# Patient Record
Sex: Male | Born: 1945 | Race: Black or African American | Hispanic: No | Smoking: Former smoker
Health system: Southern US, Community
[De-identification: ages and names within clinical notes are randomized; demographics above are authoritative.]

## PROBLEM LIST (undated history)

## (undated) DIAGNOSIS — I10 Essential (primary) hypertension: Secondary | ICD-10-CM

## (undated) DIAGNOSIS — C61 Malignant neoplasm of prostate: Secondary | ICD-10-CM

## (undated) DIAGNOSIS — E7800 Pure hypercholesterolemia, unspecified: Secondary | ICD-10-CM

## (undated) DIAGNOSIS — E119 Type 2 diabetes mellitus without complications: Secondary | ICD-10-CM

## (undated) DIAGNOSIS — F209 Schizophrenia, unspecified: Secondary | ICD-10-CM

## (undated) HISTORY — PX: PROSTATE CRYOABLATION: SUR358

---

## 2005-11-17 ENCOUNTER — Inpatient Hospital Stay: Payer: Self-pay | Admitting: Psychiatry

## 2005-11-20 ENCOUNTER — Other Ambulatory Visit: Payer: Self-pay

## 2006-07-30 ENCOUNTER — Inpatient Hospital Stay: Payer: Self-pay | Admitting: Unknown Physician Specialty

## 2007-03-06 ENCOUNTER — Emergency Department: Payer: Self-pay | Admitting: Emergency Medicine

## 2007-03-06 ENCOUNTER — Other Ambulatory Visit: Payer: Self-pay

## 2007-09-21 ENCOUNTER — Emergency Department: Payer: Self-pay | Admitting: Emergency Medicine

## 2007-09-21 ENCOUNTER — Other Ambulatory Visit: Payer: Self-pay

## 2008-08-26 ENCOUNTER — Ambulatory Visit: Payer: Self-pay | Admitting: Urology

## 2008-09-09 ENCOUNTER — Ambulatory Visit: Payer: Self-pay | Admitting: Urology

## 2010-10-26 ENCOUNTER — Inpatient Hospital Stay: Payer: Self-pay | Admitting: Internal Medicine

## 2010-11-01 ENCOUNTER — Inpatient Hospital Stay: Payer: Self-pay | Admitting: Psychiatry

## 2011-08-12 ENCOUNTER — Emergency Department: Payer: Self-pay | Admitting: Emergency Medicine

## 2013-03-08 ENCOUNTER — Emergency Department: Payer: Self-pay | Admitting: Emergency Medicine

## 2013-03-08 LAB — TSH: Thyroid Stimulating Horm: 1.09 u[IU]/mL

## 2013-03-08 LAB — COMPREHENSIVE METABOLIC PANEL
Alkaline Phosphatase: 98 U/L (ref 50–136)
BUN: 20 mg/dL — ABNORMAL HIGH (ref 7–18)
Bilirubin,Total: 0.5 mg/dL (ref 0.2–1.0)
Chloride: 96 mmol/L — ABNORMAL LOW (ref 98–107)
Co2: 22 mmol/L (ref 21–32)
Creatinine: 1.4 mg/dL — ABNORMAL HIGH (ref 0.60–1.30)
EGFR (African American): >60
Glucose: 459 mg/dL — ABNORMAL HIGH (ref 65–99)
Potassium: 4.5 mmol/L (ref 3.5–5.1)
SGOT(AST): 27 U/L (ref 15–37)
SGPT (ALT): 33 U/L (ref 12–78)
Sodium: 132 mmol/L — ABNORMAL LOW (ref 136–145)

## 2013-03-08 LAB — CBC
HCT: 42.8 % (ref 40.0–52.0)
MCHC: 32.4 g/dL (ref 32.0–36.0)
RBC: 4.91 10*6/uL (ref 4.40–5.90)

## 2013-03-08 LAB — ETHANOL
Ethanol %: <0.003 % (ref 0.000–0.080)
Ethanol: <3 mg/dL

## 2013-03-09 LAB — URINALYSIS, COMPLETE
Bacteria: NONE SEEN
Bilirubin,UR: NEGATIVE
Blood: NEGATIVE
Leukocyte Esterase: NEGATIVE
Ph: 6 (ref 4.5–8.0)
Protein: NEGATIVE
RBC,UR: <1 /HPF (ref 0–5)
Specific Gravity: 1.027 (ref 1.003–1.030)
Squamous Epithelial: NONE SEEN
WBC UR: NONE SEEN /HPF (ref 0–5)

## 2013-03-09 LAB — DRUG SCREEN, URINE
Amphetamines, Ur Screen: NEGATIVE (ref ?–1000)
Barbiturates, Ur Screen: NEGATIVE (ref ?–200)
Cannabinoid 50 Ng, Ur ~~LOC~~: NEGATIVE (ref ?–50)
MDMA (Ecstasy)Ur Screen: NEGATIVE (ref ?–500)
Phencyclidine (PCP) Ur S: NEGATIVE (ref ?–25)

## 2013-03-14 LAB — BASIC METABOLIC PANEL
Anion Gap: 7 (ref 7–16)
Calcium, Total: 10.1 mg/dL (ref 8.5–10.1)
Co2: 27 mmol/L (ref 21–32)
Creatinine: 1.13 mg/dL (ref 0.60–1.30)
EGFR (African American): >60
EGFR (Non-African Amer.): >60
Glucose: 60 mg/dL — ABNORMAL LOW (ref 65–99)
Potassium: 3.8 mmol/L (ref 3.5–5.1)

## 2013-12-04 ENCOUNTER — Inpatient Hospital Stay: Payer: Self-pay | Admitting: Internal Medicine

## 2013-12-04 LAB — URINALYSIS, COMPLETE
Bacteria: NONE SEEN
Bilirubin,UR: NEGATIVE
Blood: NEGATIVE
Glucose,UR: >=500 mg/dL (ref 0–75)
LEUKOCYTE ESTERASE: NEGATIVE
Nitrite: NEGATIVE
PH: 5 (ref 4.5–8.0)
Protein: NEGATIVE
RBC,UR: NONE SEEN /HPF (ref 0–5)
SQUAMOUS EPITHELIAL: NONE SEEN
Specific Gravity: 1.029 (ref 1.003–1.030)
WBC UR: <1 /HPF (ref 0–5)

## 2013-12-04 LAB — CBC WITH DIFFERENTIAL/PLATELET
Basophil #: 0 10*3/uL (ref 0.0–0.1)
Basophil %: 0.6 %
EOS PCT: 0.3 %
Eosinophil #: 0 10*3/uL (ref 0.0–0.7)
HCT: 39.8 % — ABNORMAL LOW (ref 40.0–52.0)
HGB: 12.7 g/dL — ABNORMAL LOW (ref 13.0–18.0)
LYMPHS PCT: 11.7 %
Lymphocyte #: 1 10*3/uL (ref 1.0–3.6)
MCH: 29.4 pg (ref 26.0–34.0)
MCHC: 32 g/dL (ref 32.0–36.0)
MCV: 92 fL (ref 80–100)
MONO ABS: 0.6 x10 3/mm (ref 0.2–1.0)
Monocyte %: 7.1 %
NEUTROS PCT: 80.3 %
Neutrophil #: 6.8 10*3/uL — ABNORMAL HIGH (ref 1.4–6.5)
Platelet: 219 10*3/uL (ref 150–440)
RBC: 4.33 10*6/uL — AB (ref 4.40–5.90)
RDW: 12.9 % (ref 11.5–14.5)
WBC: 8.5 10*3/uL (ref 3.8–10.6)

## 2013-12-04 LAB — COMPREHENSIVE METABOLIC PANEL
ANION GAP: 13 (ref 7–16)
Albumin: 3.6 g/dL (ref 3.4–5.0)
Alkaline Phosphatase: 72 U/L
BILIRUBIN TOTAL: 0.5 mg/dL (ref 0.2–1.0)
BUN: 29 mg/dL — AB (ref 7–18)
CO2: 22 mmol/L (ref 21–32)
Calcium, Total: 9.8 mg/dL (ref 8.5–10.1)
Chloride: 101 mmol/L (ref 98–107)
Creatinine: 1.28 mg/dL (ref 0.60–1.30)
EGFR (African American): >60
EGFR (Non-African Amer.): 58 — ABNORMAL LOW
Glucose: 375 mg/dL — ABNORMAL HIGH (ref 65–99)
Osmolality: 293 (ref 275–301)
POTASSIUM: 4.6 mmol/L (ref 3.5–5.1)
SGOT(AST): 39 U/L — ABNORMAL HIGH (ref 15–37)
SGPT (ALT): 27 U/L (ref 12–78)
Sodium: 136 mmol/L (ref 136–145)
Total Protein: 7.3 g/dL (ref 6.4–8.2)

## 2013-12-04 LAB — TROPONIN I: Troponin-I: 0.21 ng/mL — ABNORMAL HIGH

## 2013-12-04 LAB — DRUG SCREEN, URINE
Amphetamines, Ur Screen: NEGATIVE (ref ?–1000)
Barbiturates, Ur Screen: NEGATIVE (ref ?–200)
Benzodiazepine, Ur Scrn: NEGATIVE (ref ?–200)
CANNABINOID 50 NG, UR ~~LOC~~: NEGATIVE (ref ?–50)
COCAINE METABOLITE, UR ~~LOC~~: NEGATIVE (ref ?–300)
MDMA (Ecstasy)Ur Screen: NEGATIVE (ref ?–500)
Methadone, Ur Screen: NEGATIVE (ref ?–300)
Opiate, Ur Screen: NEGATIVE (ref ?–300)
Phencyclidine (PCP) Ur S: NEGATIVE (ref ?–25)
TRICYCLIC, UR SCREEN: NEGATIVE (ref ?–1000)

## 2013-12-04 LAB — MAGNESIUM: Magnesium: 2.4 mg/dL

## 2013-12-04 LAB — TSH: Thyroid Stimulating Horm: 0.701 u[IU]/mL

## 2013-12-04 LAB — AMMONIA: Ammonia, Plasma: 15 mcmol/L (ref 11–32)

## 2013-12-04 LAB — ETHANOL
Ethanol %: <0.003 % (ref 0.000–0.080)
Ethanol: <3 mg/dL

## 2013-12-05 LAB — CBC WITH DIFFERENTIAL/PLATELET
BASOS ABS: 0.1 10*3/uL (ref 0.0–0.1)
Basophil %: 0.9 %
EOS PCT: 0.3 %
Eosinophil #: 0 10*3/uL (ref 0.0–0.7)
HCT: 39 % — ABNORMAL LOW (ref 40.0–52.0)
HGB: 12.8 g/dL — AB (ref 13.0–18.0)
LYMPHS ABS: 1.6 10*3/uL (ref 1.0–3.6)
LYMPHS PCT: 20.6 %
MCH: 29.9 pg (ref 26.0–34.0)
MCHC: 32.9 g/dL (ref 32.0–36.0)
MCV: 91 fL (ref 80–100)
MONO ABS: 0.7 x10 3/mm (ref 0.2–1.0)
Monocyte %: 9 %
Neutrophil #: 5.4 10*3/uL (ref 1.4–6.5)
Neutrophil %: 69.2 %
Platelet: 215 10*3/uL (ref 150–440)
RBC: 4.28 10*6/uL — ABNORMAL LOW (ref 4.40–5.90)
RDW: 12.8 % (ref 11.5–14.5)
WBC: 7.8 10*3/uL (ref 3.8–10.6)

## 2013-12-05 LAB — CK TOTAL AND CKMB (NOT AT ARMC)
CK, TOTAL: 325 U/L — AB (ref 35–232)
CK, TOTAL: 480 U/L — AB (ref 35–232)
CK, Total: 273 U/L — ABNORMAL HIGH (ref 35–232)
CK-MB: 6.9 ng/mL — AB (ref 0.5–3.6)
CK-MB: 6.9 ng/mL — AB (ref 0.5–3.6)
CK-MB: 7 ng/mL — ABNORMAL HIGH (ref 0.5–3.6)

## 2013-12-05 LAB — COMPREHENSIVE METABOLIC PANEL
ALBUMIN: 3.4 g/dL (ref 3.4–5.0)
ANION GAP: 11 (ref 7–16)
Alkaline Phosphatase: 71 U/L
BUN: 19 mg/dL — ABNORMAL HIGH (ref 7–18)
Bilirubin,Total: 0.4 mg/dL (ref 0.2–1.0)
CALCIUM: 9.4 mg/dL (ref 8.5–10.1)
CHLORIDE: 110 mmol/L — AB (ref 98–107)
Co2: 26 mmol/L (ref 21–32)
Creatinine: 1.18 mg/dL (ref 0.60–1.30)
EGFR (African American): >60
Glucose: 68 mg/dL (ref 65–99)
Osmolality: 293 (ref 275–301)
Potassium: 3.7 mmol/L (ref 3.5–5.1)
SGOT(AST): 28 U/L (ref 15–37)
SGPT (ALT): 29 U/L (ref 12–78)
SODIUM: 147 mmol/L — AB (ref 136–145)
Total Protein: 7 g/dL (ref 6.4–8.2)

## 2013-12-05 LAB — TROPONIN I
TROPONIN-I: 1.9 ng/mL — AB
Troponin-I: 1.7 ng/mL — ABNORMAL HIGH
Troponin-I: 1.8 ng/mL — ABNORMAL HIGH

## 2013-12-05 LAB — MAGNESIUM: Magnesium: 2.3 mg/dL

## 2013-12-05 LAB — CK: CK, TOTAL: 497 U/L — AB (ref 35–232)

## 2013-12-05 LAB — TSH: THYROID STIMULATING HORM: 0.44 u[IU]/mL — AB

## 2013-12-05 LAB — CK-MB: CK-MB: 6.8 ng/mL — AB (ref 0.5–3.6)

## 2013-12-06 LAB — URINE CULTURE

## 2013-12-06 LAB — CK: CK, TOTAL: 315 U/L — AB (ref 35–232)

## 2013-12-06 LAB — TROPONIN I: Troponin-I: 0.76 ng/mL — ABNORMAL HIGH

## 2013-12-09 ENCOUNTER — Inpatient Hospital Stay: Payer: Self-pay | Admitting: Psychiatry

## 2013-12-09 LAB — CULTURE, BLOOD (SINGLE)

## 2013-12-18 LAB — HEMOGLOBIN A1C: Hemoglobin A1C: 14.8 % — ABNORMAL HIGH (ref 4.2–6.3)

## 2014-06-16 ENCOUNTER — Observation Stay: Payer: Self-pay | Admitting: Internal Medicine

## 2014-06-16 LAB — COMPREHENSIVE METABOLIC PANEL
ALK PHOS: 79 U/L
Albumin: 3.6 g/dL (ref 3.4–5.0)
Anion Gap: 11 (ref 7–16)
BILIRUBIN TOTAL: 0.7 mg/dL (ref 0.2–1.0)
BUN: 31 mg/dL — ABNORMAL HIGH (ref 7–18)
Calcium, Total: 9.6 mg/dL (ref 8.5–10.1)
Chloride: 81 mmol/L — ABNORMAL LOW (ref 98–107)
Co2: 24 mmol/L (ref 21–32)
Creatinine: 1.79 mg/dL — ABNORMAL HIGH (ref 0.60–1.30)
EGFR (African American): 45 — ABNORMAL LOW
EGFR (Non-African Amer.): 39 — ABNORMAL LOW
GLUCOSE: 808 mg/dL — AB (ref 65–99)
Osmolality: 281 (ref 275–301)
POTASSIUM: 5.4 mmol/L — AB (ref 3.5–5.1)
SGOT(AST): 38 U/L — ABNORMAL HIGH (ref 15–37)
SGPT (ALT): 24 U/L
SODIUM: 116 mmol/L — AB (ref 136–145)
Total Protein: 8.3 g/dL — ABNORMAL HIGH (ref 6.4–8.2)

## 2014-06-16 LAB — CBC WITH DIFFERENTIAL/PLATELET
BASOS PCT: 0.9 %
Basophil #: 0.1 10*3/uL (ref 0.0–0.1)
Eosinophil #: 0 10*3/uL (ref 0.0–0.7)
Eosinophil %: 0.5 %
HCT: 46.3 % (ref 40.0–52.0)
HGB: 14.3 g/dL (ref 13.0–18.0)
LYMPHS ABS: 1.7 10*3/uL (ref 1.0–3.6)
Lymphocyte %: 22.7 %
MCH: 26.8 pg (ref 26.0–34.0)
MCHC: 30.9 g/dL — AB (ref 32.0–36.0)
MCV: 87 fL (ref 80–100)
MONO ABS: 0.6 x10 3/mm (ref 0.2–1.0)
Monocyte %: 8.6 %
Neutrophil #: 4.9 10*3/uL (ref 1.4–6.5)
Neutrophil %: 67.3 %
PLATELETS: 229 10*3/uL (ref 150–440)
RBC: 5.35 10*6/uL (ref 4.40–5.90)
RDW: 15.8 % — AB (ref 11.5–14.5)
WBC: 7.3 10*3/uL (ref 3.8–10.6)

## 2014-06-16 LAB — BASIC METABOLIC PANEL
ANION GAP: 8 (ref 7–16)
BUN: 28 mg/dL — ABNORMAL HIGH (ref 7–18)
Calcium, Total: 8.4 mg/dL — ABNORMAL LOW (ref 8.5–10.1)
Chloride: 91 mmol/L — ABNORMAL LOW (ref 98–107)
Co2: 28 mmol/L (ref 21–32)
Creatinine: 1.58 mg/dL — ABNORMAL HIGH (ref 0.60–1.30)
EGFR (African American): 52 — ABNORMAL LOW
EGFR (Non-African Amer.): 45 — ABNORMAL LOW
GLUCOSE: 558 mg/dL — AB (ref 65–99)
Osmolality: 286 (ref 275–301)
Potassium: 4.6 mmol/L (ref 3.5–5.1)
Sodium: 127 mmol/L — ABNORMAL LOW (ref 136–145)

## 2014-06-16 LAB — URINALYSIS, COMPLETE
BACTERIA: NONE SEEN
BILIRUBIN, UR: NEGATIVE
Blood: NEGATIVE
LEUKOCYTE ESTERASE: NEGATIVE
Nitrite: NEGATIVE
Ph: 6 (ref 4.5–8.0)
Protein: NEGATIVE
RBC, UR: NONE SEEN /HPF (ref 0–5)
Specific Gravity: 1.018 (ref 1.003–1.030)
Squamous Epithelial: <1
WBC UR: NONE SEEN /HPF (ref 0–5)

## 2014-06-17 LAB — BASIC METABOLIC PANEL
Anion Gap: 9 (ref 7–16)
BUN: 21 mg/dL — ABNORMAL HIGH (ref 7–18)
CREATININE: 1.18 mg/dL (ref 0.60–1.30)
Calcium, Total: 8.8 mg/dL (ref 8.5–10.1)
Chloride: 98 mmol/L (ref 98–107)
Co2: 29 mmol/L (ref 21–32)
EGFR (African American): >60
EGFR (Non-African Amer.): >60
Glucose: 129 mg/dL — ABNORMAL HIGH (ref 65–99)
Osmolality: 277 (ref 275–301)
Potassium: 3.4 mmol/L — ABNORMAL LOW (ref 3.5–5.1)
Sodium: 136 mmol/L (ref 136–145)

## 2014-06-17 LAB — CBC WITH DIFFERENTIAL/PLATELET
BASOS ABS: 0 10*3/uL (ref 0.0–0.1)
Basophil %: 0.5 %
Eosinophil #: 0.1 10*3/uL (ref 0.0–0.7)
Eosinophil %: 1.6 %
HCT: 42.3 % (ref 40.0–52.0)
HGB: 13.1 g/dL (ref 13.0–18.0)
LYMPHS ABS: 2 10*3/uL (ref 1.0–3.6)
Lymphocyte %: 32.3 %
MCH: 26 pg (ref 26.0–34.0)
MCHC: 31 g/dL — AB (ref 32.0–36.0)
MCV: 84 fL (ref 80–100)
Monocyte #: 0.6 x10 3/mm (ref 0.2–1.0)
Monocyte %: 9.8 %
NEUTROS ABS: 3.5 10*3/uL (ref 1.4–6.5)
NEUTROS PCT: 55.8 %
PLATELETS: 195 10*3/uL (ref 150–440)
RBC: 5.04 10*6/uL (ref 4.40–5.90)
RDW: 15.9 % — AB (ref 11.5–14.5)
WBC: 6.2 10*3/uL (ref 3.8–10.6)

## 2014-06-22 ENCOUNTER — Inpatient Hospital Stay (HOSPITAL_COMMUNITY)
Admission: EM | Admit: 2014-06-22 | Discharge: 2014-06-29 | DRG: 637 | Disposition: A | Discharge status: Home / Self Care | Payer: Medicare Other | Attending: Internal Medicine | Admitting: Internal Medicine

## 2014-06-22 ENCOUNTER — Emergency Department (HOSPITAL_COMMUNITY): Payer: Medicare Other

## 2014-06-22 ENCOUNTER — Encounter (HOSPITAL_COMMUNITY): Payer: Self-pay | Admitting: Emergency Medicine

## 2014-06-22 DIAGNOSIS — Z794 Long term (current) use of insulin: Secondary | ICD-10-CM

## 2014-06-22 DIAGNOSIS — G9341 Metabolic encephalopathy: Secondary | ICD-10-CM | POA: Diagnosis present

## 2014-06-22 DIAGNOSIS — R5381 Other malaise: Secondary | ICD-10-CM | POA: Diagnosis not present

## 2014-06-22 DIAGNOSIS — Z91199 Patient's noncompliance with other medical treatment and regimen due to unspecified reason: Secondary | ICD-10-CM

## 2014-06-22 DIAGNOSIS — R410 Disorientation, unspecified: Secondary | ICD-10-CM

## 2014-06-22 DIAGNOSIS — R5383 Other fatigue: Secondary | ICD-10-CM | POA: Diagnosis not present

## 2014-06-22 DIAGNOSIS — E1310 Other specified diabetes mellitus with ketoacidosis without coma: Principal | ICD-10-CM | POA: Diagnosis present

## 2014-06-22 DIAGNOSIS — Z9119 Patient's noncompliance with other medical treatment and regimen: Secondary | ICD-10-CM

## 2014-06-22 DIAGNOSIS — R4182 Altered mental status, unspecified: Secondary | ICD-10-CM | POA: Diagnosis present

## 2014-06-22 DIAGNOSIS — E1110 Type 2 diabetes mellitus with ketoacidosis without coma: Secondary | ICD-10-CM | POA: Diagnosis present

## 2014-06-22 DIAGNOSIS — I10 Essential (primary) hypertension: Secondary | ICD-10-CM | POA: Diagnosis present

## 2014-06-22 DIAGNOSIS — F2089 Other schizophrenia: Secondary | ICD-10-CM

## 2014-06-22 DIAGNOSIS — R6889 Other general symptoms and signs: Secondary | ICD-10-CM

## 2014-06-22 DIAGNOSIS — E1010 Type 1 diabetes mellitus with ketoacidosis without coma: Secondary | ICD-10-CM

## 2014-06-22 DIAGNOSIS — R32 Unspecified urinary incontinence: Secondary | ICD-10-CM | POA: Diagnosis present

## 2014-06-22 DIAGNOSIS — F201 Disorganized schizophrenia: Secondary | ICD-10-CM

## 2014-06-22 DIAGNOSIS — F209 Schizophrenia, unspecified: Secondary | ICD-10-CM | POA: Diagnosis present

## 2014-06-22 HISTORY — DX: Malignant neoplasm of prostate: C61

## 2014-06-22 HISTORY — DX: Essential (primary) hypertension: I10

## 2014-06-22 HISTORY — DX: Schizophrenia, unspecified: F20.9

## 2014-06-22 HISTORY — DX: Pure hypercholesterolemia, unspecified: E78.00

## 2014-06-22 HISTORY — DX: Type 2 diabetes mellitus without complications: E11.9

## 2014-06-22 LAB — RAPID URINE DRUG SCREEN, HOSP PERFORMED
AMPHETAMINES: NOT DETECTED
BARBITURATES: NOT DETECTED
BENZODIAZEPINES: NOT DETECTED
Cocaine: NOT DETECTED
Opiates: NOT DETECTED
TETRAHYDROCANNABINOL: NOT DETECTED

## 2014-06-22 LAB — URINALYSIS, ROUTINE W REFLEX MICROSCOPIC
BILIRUBIN URINE: NEGATIVE
Glucose, UA: >1000 mg/dL — AB
HGB URINE DIPSTICK: NEGATIVE
KETONES UR: 15 mg/dL — AB
Leukocytes, UA: NEGATIVE
Nitrite: NEGATIVE
PROTEIN: NEGATIVE mg/dL
Specific Gravity, Urine: 1.017 (ref 1.005–1.030)
UROBILINOGEN UA: 1 mg/dL (ref 0.0–1.0)
pH: 6.5 (ref 5.0–8.0)

## 2014-06-22 LAB — URINE MICROSCOPIC-ADD ON

## 2014-06-22 LAB — CBC WITH DIFFERENTIAL/PLATELET
BASOS ABS: 0 10*3/uL (ref 0.0–0.1)
Basophils Relative: 0 % (ref 0–1)
EOS PCT: 0 % (ref 0–5)
Eosinophils Absolute: 0 10*3/uL (ref 0.0–0.7)
HEMATOCRIT: 42.6 % (ref 39.0–52.0)
Hemoglobin: 14.1 g/dL (ref 13.0–17.0)
Lymphocytes Relative: 20 % (ref 12–46)
Lymphs Abs: 1.6 10*3/uL (ref 0.7–4.0)
MCH: 26.8 pg (ref 26.0–34.0)
MCHC: 33.1 g/dL (ref 30.0–36.0)
MCV: 81 fL (ref 78.0–100.0)
MONO ABS: 0.6 10*3/uL (ref 0.1–1.0)
Monocytes Relative: 7 % (ref 3–12)
Neutro Abs: 5.9 10*3/uL (ref 1.7–7.7)
Neutrophils Relative %: 73 % (ref 43–77)
Platelets: 220 10*3/uL (ref 150–400)
RBC: 5.26 MIL/uL (ref 4.22–5.81)
RDW: 14.5 % (ref 11.5–15.5)
WBC: 8.2 10*3/uL (ref 4.0–10.5)

## 2014-06-22 LAB — CBG MONITORING, ED: Glucose-Capillary: 520 mg/dL — ABNORMAL HIGH (ref 70–99)

## 2014-06-22 MED ORDER — SODIUM CHLORIDE 0.9 % IV BOLUS (SEPSIS)
500.0000 mL | Freq: Once | INTRAVENOUS | Status: AC
  Administered 2014-06-22: 500 mL via INTRAVENOUS

## 2014-06-22 MED ORDER — ZIPRASIDONE MESYLATE 20 MG IM SOLR: 10.0000 mg | Freq: Once | INTRAMUSCULAR | Status: DC

## 2014-06-22 MED ORDER — INSULIN ASPART 100 UNIT/ML IV SOLN
10.0000 [IU] | Freq: Once | INTRAVENOUS | Status: AC
  Administered 2014-06-23: 10 [IU] via INTRAVENOUS
  Filled 2014-06-22: qty 0.1

## 2014-06-22 MED ORDER — SODIUM CHLORIDE 0.9 % IV BOLUS (SEPSIS)
500.0000 mL | Freq: Once | INTRAVENOUS | Status: AC
  Administered 2014-06-23: 500 mL via INTRAVENOUS
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
## 2014-06-23 DIAGNOSIS — F209 Schizophrenia, unspecified: Secondary | ICD-10-CM | POA: Diagnosis present

## 2014-06-23 DIAGNOSIS — I10 Essential (primary) hypertension: Secondary | ICD-10-CM | POA: Diagnosis present

## 2014-06-23 DIAGNOSIS — R5381 Other malaise: Secondary | ICD-10-CM | POA: Diagnosis present

## 2014-06-23 DIAGNOSIS — Z91199 Patient's noncompliance with other medical treatment and regimen due to unspecified reason: Secondary | ICD-10-CM | POA: Diagnosis not present

## 2014-06-23 DIAGNOSIS — E1110 Type 2 diabetes mellitus with ketoacidosis without coma: Secondary | ICD-10-CM | POA: Diagnosis present

## 2014-06-23 DIAGNOSIS — R5383 Other fatigue: Secondary | ICD-10-CM | POA: Diagnosis present

## 2014-06-23 DIAGNOSIS — R32 Unspecified urinary incontinence: Secondary | ICD-10-CM | POA: Diagnosis present

## 2014-06-23 DIAGNOSIS — F201 Disorganized schizophrenia: Secondary | ICD-10-CM

## 2014-06-23 DIAGNOSIS — E1310 Other specified diabetes mellitus with ketoacidosis without coma: Secondary | ICD-10-CM | POA: Diagnosis present

## 2014-06-23 DIAGNOSIS — E1010 Type 1 diabetes mellitus with ketoacidosis without coma: Secondary | ICD-10-CM

## 2014-06-23 DIAGNOSIS — F259 Schizoaffective disorder, unspecified: Secondary | ICD-10-CM

## 2014-06-23 DIAGNOSIS — G9341 Metabolic encephalopathy: Secondary | ICD-10-CM | POA: Diagnosis present

## 2014-06-23 DIAGNOSIS — Z794 Long term (current) use of insulin: Secondary | ICD-10-CM | POA: Diagnosis not present

## 2014-06-23 DIAGNOSIS — R4182 Altered mental status, unspecified: Secondary | ICD-10-CM | POA: Diagnosis present

## 2014-06-23 LAB — GLUCOSE, CAPILLARY
GLUCOSE-CAPILLARY: 273 mg/dL — AB (ref 70–99)
GLUCOSE-CAPILLARY: 155 mg/dL — AB (ref 70–99)
GLUCOSE-CAPILLARY: 120 mg/dL — AB (ref 70–99)
GLUCOSE-CAPILLARY: 123 mg/dL — AB (ref 70–99)
GLUCOSE-CAPILLARY: 172 mg/dL — AB (ref 70–99)
GLUCOSE-CAPILLARY: 210 mg/dL — AB (ref 70–99)
GLUCOSE-CAPILLARY: 128 mg/dL — AB (ref 70–99)
GLUCOSE-CAPILLARY: 141 mg/dL — AB (ref 70–99)
GLUCOSE-CAPILLARY: 159 mg/dL — AB (ref 70–99)
Glucose-Capillary: 269 mg/dL — ABNORMAL HIGH (ref 70–99)
Glucose-Capillary: 210 mg/dL — ABNORMAL HIGH (ref 70–99)
Glucose-Capillary: 103 mg/dL — ABNORMAL HIGH (ref 70–99)
Glucose-Capillary: 186 mg/dL — ABNORMAL HIGH (ref 70–99)
Glucose-Capillary: 127 mg/dL — ABNORMAL HIGH (ref 70–99)
Glucose-Capillary: 110 mg/dL — ABNORMAL HIGH (ref 70–99)
Glucose-Capillary: 153 mg/dL — ABNORMAL HIGH (ref 70–99)
Glucose-Capillary: 158 mg/dL — ABNORMAL HIGH (ref 70–99)
Glucose-Capillary: 150 mg/dL — ABNORMAL HIGH (ref 70–99)

## 2014-06-23 LAB — BASIC METABOLIC PANEL
Anion gap: 26 — ABNORMAL HIGH (ref 5–15)
Anion gap: 22 — ABNORMAL HIGH (ref 5–15)
Anion gap: 16 — ABNORMAL HIGH (ref 5–15)
Anion gap: 49 — ABNORMAL HIGH (ref 5–15)
Anion gap: 13 (ref 5–15)
BUN: 25 mg/dL — AB (ref 6–23)
BUN: 23 mg/dL (ref 6–23)
BUN: 20 mg/dL (ref 6–23)
BUN: 16 mg/dL (ref 6–23)
BUN: 15 mg/dL (ref 6–23)
CALCIUM: 9.2 mg/dL (ref 8.4–10.5)
CALCIUM: 8.5 mg/dL (ref 8.4–10.5)
CO2: 16 mEq/L — ABNORMAL LOW (ref 19–32)
CO2: 18 mEq/L — ABNORMAL LOW (ref 19–32)
CO2: 21 mEq/L (ref 19–32)
CO2: 18 mEq/L — ABNORMAL LOW (ref 19–32)
CO2: 20 mEq/L (ref 19–32)
CREATININE: 1.16 mg/dL (ref 0.50–1.35)
Calcium: 8.9 mg/dL (ref 8.4–10.5)
Calcium: 8.6 mg/dL (ref 8.4–10.5)
Calcium: 8.5 mg/dL (ref 8.4–10.5)
Chloride: 99 mEq/L (ref 96–112)
Chloride: 103 mEq/L (ref 96–112)
Chloride: 106 mEq/L (ref 96–112)
Chloride: 92 mEq/L — ABNORMAL LOW (ref 96–112)
Chloride: 108 mEq/L (ref 96–112)
Creatinine, Ser: 1.35 mg/dL (ref 0.50–1.35)
Creatinine, Ser: 1.33 mg/dL (ref 0.50–1.35)
Creatinine, Ser: 0.95 mg/dL (ref 0.50–1.35)
Creatinine, Ser: 0.96 mg/dL (ref 0.50–1.35)
GFR calc Af Amer: >90 mL/min (ref >90)
GFR calc Af Amer: >90 mL/min (ref >90)
GFR calc non Af Amer: 64 mL/min — ABNORMAL LOW (ref >90)
GFR calc non Af Amer: 84 mL/min — ABNORMAL LOW (ref >90)
GFR, EST AFRICAN AMERICAN: 62 mL/min — AB (ref >90)
GFR, EST AFRICAN AMERICAN: 63 mL/min — AB (ref >90)
GFR, EST AFRICAN AMERICAN: 74 mL/min — AB (ref >90)
GFR, EST NON AFRICAN AMERICAN: 53 mL/min — AB (ref >90)
GFR, EST NON AFRICAN AMERICAN: 54 mL/min — AB (ref >90)
GFR, EST NON AFRICAN AMERICAN: 85 mL/min — AB (ref >90)
GLUCOSE: 164 mg/dL — AB (ref 70–99)
Glucose, Bld: 295 mg/dL — ABNORMAL HIGH (ref 70–99)
Glucose, Bld: 199 mg/dL — ABNORMAL HIGH (ref 70–99)
Glucose, Bld: 159 mg/dL — ABNORMAL HIGH (ref 70–99)
Glucose, Bld: 164 mg/dL — ABNORMAL HIGH (ref 70–99)
POTASSIUM: 3.9 meq/L (ref 3.7–5.3)
POTASSIUM: 3.7 meq/L (ref 3.7–5.3)
POTASSIUM: 3.8 meq/L (ref 3.7–5.3)
Potassium: 4 mEq/L (ref 3.7–5.3)
Potassium: 3.5 mEq/L — ABNORMAL LOW (ref 3.7–5.3)
SODIUM: 141 meq/L (ref 137–147)
SODIUM: 143 meq/L (ref 137–147)
Sodium: 143 mEq/L (ref 137–147)
Sodium: 159 mEq/L — ABNORMAL HIGH (ref 137–147)
Sodium: 141 mEq/L (ref 137–147)

## 2014-06-23 LAB — MRSA PCR SCREENING: MRSA by PCR: NEGATIVE

## 2014-06-23 LAB — COMPREHENSIVE METABOLIC PANEL
ALT: 14 U/L (ref 0–53)
AST: 14 U/L (ref 0–37)
Albumin: 3.8 g/dL (ref 3.5–5.2)
Alkaline Phosphatase: 75 U/L (ref 39–117)
Anion gap: 28 — ABNORMAL HIGH (ref 5–15)
BUN: 25 mg/dL — AB (ref 6–23)
CALCIUM: 9.9 mg/dL (ref 8.4–10.5)
CO2: 18 meq/L — AB (ref 19–32)
Chloride: 89 mEq/L — ABNORMAL LOW (ref 96–112)
Creatinine, Ser: 1.36 mg/dL — ABNORMAL HIGH (ref 0.50–1.35)
GFR calc non Af Amer: 53 mL/min — ABNORMAL LOW (ref >90)
GFR, EST AFRICAN AMERICAN: 61 mL/min — AB (ref >90)
Glucose, Bld: 510 mg/dL — ABNORMAL HIGH (ref 70–99)
Potassium: 4.1 mEq/L (ref 3.7–5.3)
Sodium: 135 mEq/L — ABNORMAL LOW (ref 137–147)
Total Bilirubin: 0.4 mg/dL (ref 0.3–1.2)
Total Protein: 7.7 g/dL (ref 6.0–8.3)

## 2014-06-23 LAB — PROTIME-INR
INR: 0.99 (ref 0.00–1.49)
PROTHROMBIN TIME: 13.1 s (ref 11.6–15.2)

## 2014-06-23 LAB — LACTIC ACID, PLASMA: LACTIC ACID, VENOUS: 2.7 mmol/L — AB (ref 0.5–2.2)

## 2014-06-23 LAB — CBG MONITORING, ED
Glucose-Capillary: 391 mg/dL — ABNORMAL HIGH (ref 70–99)
Glucose-Capillary: 256 mg/dL — ABNORMAL HIGH (ref 70–99)

## 2014-06-23 LAB — APTT: aPTT: 22 seconds — ABNORMAL LOW (ref 24–37)

## 2014-06-23 LAB — ETHANOL

## 2014-06-23 LAB — TROPONIN I: Troponin I: <0.3 ng/mL (ref <0.30)

## 2014-06-23 MED ORDER — SODIUM CHLORIDE 0.9 % IV SOLN: INTRAVENOUS | Status: AC

## 2014-06-23 MED ORDER — CETYLPYRIDINIUM CHLORIDE 0.05 % MT LIQD
7.0000 mL | Freq: Two times a day (BID) | OROMUCOSAL | Status: DC
  Administered 2014-06-23 – 2014-06-29 (×10): 7 mL via OROMUCOSAL

## 2014-06-23 MED ORDER — SODIUM CHLORIDE 0.9 % IV BOLUS (SEPSIS)
1000.0000 mL | Freq: Once | INTRAVENOUS | Status: AC
  Administered 2014-06-23: 1000 mL via INTRAVENOUS

## 2014-06-23 MED ORDER — DEXTROSE 50 % IV SOLN: 25.0000 mL | INTRAVENOUS | Status: DC | PRN

## 2014-06-23 MED ORDER — HEPARIN SODIUM (PORCINE) 5000 UNIT/ML IJ SOLN
5000.0000 [IU] | Freq: Three times a day (TID) | INTRAMUSCULAR | Status: DC
  Administered 2014-06-23 – 2014-06-29 (×19): 5000 [IU] via SUBCUTANEOUS
  Filled 2014-06-23 (×22): qty 1

## 2014-06-23 MED ORDER — CHLORHEXIDINE GLUCONATE 0.12 % MT SOLN
15.0000 mL | Freq: Two times a day (BID) | OROMUCOSAL | Status: DC
  Administered 2014-06-24 – 2014-06-29 (×11): 15 mL via OROMUCOSAL
  Filled 2014-06-23 (×14): qty 15

## 2014-06-23 MED ORDER — INSULIN REGULAR HUMAN 100 UNIT/ML IJ SOLN
INTRAMUSCULAR | Status: DC
  Administered 2014-06-23: 2.1 [IU]/h via INTRAVENOUS
  Filled 2014-06-23 (×2): qty 1

## 2014-06-23 MED ORDER — SODIUM CHLORIDE 0.9 % IV SOLN: INTRAVENOUS | Status: DC

## 2014-06-23 MED ORDER — DEXTROSE-NACL 5-0.45 % IV SOLN
INTRAVENOUS | Status: DC
  Administered 2014-06-23 – 2014-06-24 (×2): via INTRAVENOUS

## 2014-06-23 MED ORDER — DEXTROSE-NACL 5-0.45 % IV SOLN: INTRAVENOUS | Status: DC

## 2014-06-23 MED ORDER — SODIUM CHLORIDE 0.9 % IV SOLN
INTRAVENOUS | Status: DC
  Administered 2014-06-23: 05:00:00 via INTRAVENOUS

## 2014-06-23 MED ORDER — POTASSIUM CHLORIDE 10 MEQ/100ML IV SOLN
10.0000 meq | INTRAVENOUS | Status: AC
  Administered 2014-06-23 (×2): 10 meq via INTRAVENOUS
  Filled 2014-06-23 (×2): qty 100
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
## 2014-06-23 NOTE — Progress Notes (Signed)
Inpatient Diabetes Program Recommendations  AACE/ADA: New Consensus Statement on Inpatient Glycemic Control (2013)  Target Ranges:  Prepandial:   less than 140 mg/dL      Peak postprandial:   less than 180 mg/dL (1-2 hours)      Critically ill patients:  140 - 180 mg/dL   Reason for Assessment:  Note patient admitted with DKA/hyperglycemia   Note that patient was at [facility] last week for hyperglycemia but refused to be discharged home on insulin.  Thus he was discharged home on Invokana.  I will place H&P and discharge summary from [facility] on shadow chart.    Upon transition off insulin drip, consider adding Levemir 15 units daily and Novolog 4 units tid with meals.  He will need insulin at discharge, however it will be challenging to convince patient of this need and ensure compliance.  Will discuss with RN.  Adah Perl, RN, BC-ADM Inpatient Diabetes Coordinator Pager [phone]
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
## 2014-06-23 NOTE — ED Notes (Signed)
PT monitored by pulse ox, bp cuff, and 5-lead. 

## 2014-06-23 NOTE — ED Notes (Signed)
Pt continuing to remove c collar

## 2014-06-23 NOTE — ED Notes (Signed)
Pt urinated into urinal while laying in bed. PT took all clothes off and pulled 5 lead off.

## 2014-06-23 NOTE — Progress Notes (Signed)
Pt transferred from the ED around 0430, admitted to Rm/3s09. Pt is alert and oriented x3, Rash noted to upper back, red raised bumps possible insect bites without drainage. Also has old skin graft sites to Right thigh and abdomen. Placed on telemetry, currently ST. Oriented to room, instructed to call for assistance before getting out of bed, bed alarm on. Resting comfortably at this time, will continue to monitor
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
## 2014-06-23 NOTE — ED Notes (Signed)
Family at bedside. 

## 2014-06-23 NOTE — Progress Notes (Signed)
Utilization review completed.  

## 2014-06-24 ENCOUNTER — Encounter (HOSPITAL_COMMUNITY): Payer: Self-pay | Admitting: General Practice

## 2014-06-24 LAB — GLUCOSE, CAPILLARY
GLUCOSE-CAPILLARY: 119 mg/dL — AB (ref 70–99)
GLUCOSE-CAPILLARY: 119 mg/dL — AB (ref 70–99)
GLUCOSE-CAPILLARY: 121 mg/dL — AB (ref 70–99)
GLUCOSE-CAPILLARY: 135 mg/dL — AB (ref 70–99)
GLUCOSE-CAPILLARY: 136 mg/dL — AB (ref 70–99)
GLUCOSE-CAPILLARY: 145 mg/dL — AB (ref 70–99)
GLUCOSE-CAPILLARY: 170 mg/dL — AB (ref 70–99)
GLUCOSE-CAPILLARY: 177 mg/dL — AB (ref 70–99)
Glucose-Capillary: 151 mg/dL — ABNORMAL HIGH (ref 70–99)
Glucose-Capillary: 177 mg/dL — ABNORMAL HIGH (ref 70–99)
Glucose-Capillary: 180 mg/dL — ABNORMAL HIGH (ref 70–99)
Glucose-Capillary: 180 mg/dL — ABNORMAL HIGH (ref 70–99)
Glucose-Capillary: 182 mg/dL — ABNORMAL HIGH (ref 70–99)
Glucose-Capillary: 206 mg/dL — ABNORMAL HIGH (ref 70–99)
Glucose-Capillary: 221 mg/dL — ABNORMAL HIGH (ref 70–99)

## 2014-06-24 LAB — BASIC METABOLIC PANEL
Anion gap: 14 (ref 5–15)
Anion gap: 15 (ref 5–15)
Anion gap: 14 (ref 5–15)
Anion gap: 11 (ref 5–15)
Anion gap: 12 (ref 5–15)
BUN: 14 mg/dL (ref 6–23)
BUN: 14 mg/dL (ref 6–23)
BUN: 13 mg/dL (ref 6–23)
BUN: 12 mg/dL (ref 6–23)
BUN: 12 mg/dL (ref 6–23)
CO2: 21 mEq/L (ref 19–32)
CO2: 17 mEq/L — ABNORMAL LOW (ref 19–32)
CO2: 20 mEq/L (ref 19–32)
CO2: 20 mEq/L (ref 19–32)
CO2: 21 mEq/L (ref 19–32)
Calcium: 8.6 mg/dL (ref 8.4–10.5)
Calcium: 8.4 mg/dL (ref 8.4–10.5)
Calcium: 8.5 mg/dL (ref 8.4–10.5)
Calcium: 8.3 mg/dL — ABNORMAL LOW (ref 8.4–10.5)
Calcium: 8.4 mg/dL (ref 8.4–10.5)
Chloride: 107 mEq/L (ref 96–112)
Chloride: 107 mEq/L (ref 96–112)
Chloride: 106 mEq/L (ref 96–112)
Chloride: 109 mEq/L (ref 96–112)
Chloride: 108 mEq/L (ref 96–112)
Creatinine, Ser: 1.05 mg/dL (ref 0.50–1.35)
Creatinine, Ser: 0.93 mg/dL (ref 0.50–1.35)
Creatinine, Ser: 0.96 mg/dL (ref 0.50–1.35)
Creatinine, Ser: 0.96 mg/dL (ref 0.50–1.35)
Creatinine, Ser: 0.97 mg/dL (ref 0.50–1.35)
GFR calc Af Amer: 83 mL/min — ABNORMAL LOW (ref >90)
GFR calc Af Amer: >90 mL/min (ref >90)
GFR calc Af Amer: >90 mL/min (ref >90)
GFR calc Af Amer: >90 mL/min (ref >90)
GFR calc Af Amer: >90 mL/min (ref >90)
GFR calc non Af Amer: 72 mL/min — ABNORMAL LOW (ref >90)
GFR calc non Af Amer: 86 mL/min — ABNORMAL LOW (ref >90)
GFR calc non Af Amer: 84 mL/min — ABNORMAL LOW (ref >90)
GFR calc non Af Amer: 84 mL/min — ABNORMAL LOW (ref >90)
GFR calc non Af Amer: 84 mL/min — ABNORMAL LOW (ref >90)
Glucose, Bld: 153 mg/dL — ABNORMAL HIGH (ref 70–99)
Glucose, Bld: 138 mg/dL — ABNORMAL HIGH (ref 70–99)
Glucose, Bld: 196 mg/dL — ABNORMAL HIGH (ref 70–99)
Glucose, Bld: 128 mg/dL — ABNORMAL HIGH (ref 70–99)
Glucose, Bld: 126 mg/dL — ABNORMAL HIGH (ref 70–99)
Potassium: 3.7 mEq/L (ref 3.7–5.3)
Potassium: 3.8 mEq/L (ref 3.7–5.3)
Potassium: 4.6 mEq/L (ref 3.7–5.3)
Potassium: 4.1 mEq/L (ref 3.7–5.3)
Potassium: 3.8 mEq/L (ref 3.7–5.3)
Sodium: 142 mEq/L (ref 137–147)
Sodium: 139 mEq/L (ref 137–147)
Sodium: 140 mEq/L (ref 137–147)
Sodium: 140 mEq/L (ref 137–147)
Sodium: 141 mEq/L (ref 137–147)

## 2014-06-24 MED ORDER — SODIUM CHLORIDE 0.9 % IV SOLN
INTRAVENOUS | Status: DC
  Administered 2014-06-24 – 2014-06-25 (×3): via INTRAVENOUS

## 2014-06-24 MED ORDER — INSULIN DETEMIR 100 UNIT/ML ~~LOC~~ SOLN
15.0000 [IU] | Freq: Every day | SUBCUTANEOUS | Status: DC
  Administered 2014-06-24 – 2014-06-25 (×2): 15 [IU] via SUBCUTANEOUS
  Filled 2014-06-24 (×2): qty 0.15

## 2014-06-24 MED ORDER — ARIPIPRAZOLE 5 MG PO TABS
5.0000 mg | ORAL_TABLET | Freq: Two times a day (BID) | ORAL | Status: DC
Start: 2014-06-24 — End: 2014-06-29
  Administered 2014-06-24 – 2014-06-29 (×11): 5 mg via ORAL
  Filled 2014-06-24 (×15): qty 1

## 2014-06-24 MED ORDER — BENZTROPINE MESYLATE 0.5 MG PO TABS
0.5000 mg | ORAL_TABLET | Freq: Two times a day (BID) | ORAL | Status: DC
  Administered 2014-06-24 – 2014-06-29 (×11): 0.5 mg via ORAL
  Filled 2014-06-24 (×12): qty 1

## 2014-06-24 MED ORDER — SODIUM CHLORIDE 0.9 % IV BOLUS (SEPSIS)
500.0000 mL | Freq: Once | INTRAVENOUS | Status: AC
  Administered 2014-06-24: 500 mL via INTRAVENOUS

## 2014-06-24 MED ORDER — POTASSIUM CHLORIDE 10 MEQ/100ML IV SOLN
10.0000 meq | INTRAVENOUS | Status: AC
  Administered 2014-06-24 (×2): 10 meq via INTRAVENOUS
  Filled 2014-06-24: qty 100

## 2014-06-24 MED ORDER — INSULIN ASPART 100 UNIT/ML ~~LOC~~ SOLN
0.0000 [IU] | Freq: Three times a day (TID) | SUBCUTANEOUS | Status: DC
  Administered 2014-06-24: 5 [IU] via SUBCUTANEOUS
  Administered 2014-06-25: 3 [IU] via SUBCUTANEOUS
  Administered 2014-06-25: 8 [IU] via SUBCUTANEOUS
  Administered 2014-06-25: 08:00:00 via SUBCUTANEOUS
  Administered 2014-06-26 (×2): 3 [IU] via SUBCUTANEOUS
  Administered 2014-06-26: 11 [IU] via SUBCUTANEOUS

## 2014-06-24 NOTE — Progress Notes (Signed)
Pt TX to 5W-16, VSS, called report

## 2014-06-24 NOTE — Progress Notes (Addendum)
[facility] PROGRESS NOTE  Raymond Brooks RCV:[id number] DOB: [birth year] DOA: 06/22/2014 PCP: No primary provider on file.  Assessment/Plan: 1.DKA/uncontrolled diabetes mellitus -DKA resolved -Will transition to Levemir and sliding scale insulin, follow -Stable for transfer to telemetry  2. Schizophrenia -Psychiatry consulted>> appreciate input, started on Abilify and Cogentin -Follow  3.Altered mental status -Secondary to above, improving clinically>>follow  Code Status: full Family Communication: none at bedside Disposition Plan: transfer to tele   Consultants:  Psych  Procedures:  none  Antibiotics:  none  HPI/Subjective: Patient alert x3, denies any new complaints.  Objective: Filed Vitals:   06/24/14 0807  BP:   Pulse:   Temp: 98 F (36.7 C)  Resp:     Intake/Output Summary (Last 24 hours) at 06/24/14 1134 Last data filed at 06/24/14 0600  Gross per 24 hour  Intake 2343.52 ml  Output   1300 ml  Net 1043.52 ml   Filed Weights   06/23/14 0445  Weight: 63.7 kg (140 lb 6.9 oz)    Exam:  General: alert & oriented x 3In NAD; lips smacking Cardiovascular: RRR, nl S1 s2 Respiratory: CTAB* Abdomen: soft +BS NT/ND, no masses palpable Extremities: No cyanosis and no edema    Data Reviewed: Basic Metabolic Panel:  Recent Labs Lab 06/23/14 2232 06/24/14 0116 06/24/14 0215 06/24/14 0636 06/24/14 0915  NA 141 142 139 140 140  K 3.5* 3.7 3.8 4.6 4.1  CL 108 107 107 106 109  CO2 20 21 17* 20 20  GLUCOSE 164* 153* 138* 196* 128*  BUN 15 14 14 13 12   CREATININE 0.96 1.05 0.93 0.96 0.96  CALCIUM 8.5 8.6 8.4 8.5 8.3*   Liver Function Tests:  Recent Labs Lab 06/22/14 2310  AST 14  ALT 14  ALKPHOS 75  BILITOT 0.4  PROT 7.7  ALBUMIN 3.8   No results found for this basename: LIPASE, AMYLASE,  in the last 168 hours No results found for this basename: AMMONIA,  in the last 168 hours CBC:  Recent Labs Lab 06/22/14 2310  WBC 8.2   NEUTROABS 5.9  HGB 14.1  HCT 42.6  MCV 81.0  PLT 220   Cardiac Enzymes:  Recent Labs Lab 06/22/14 2342  TROPONINI <0.30   BNP (last 3 results) No results found for this basename: PROBNP,  in the last 8760 hours CBG:  Recent Labs Lab 06/24/14 0403 06/24/14 0457 06/24/14 0608 06/24/14 0700 06/24/14 0803  GLUCAP 170* 180* 180* 182* 177*    Recent Results (from the past 240 hour(s))  MRSA PCR SCREENING     Status: None   Collection Time    06/23/14  5:38 AM      Result Value Ref Range Status   MRSA by PCR NEGATIVE  NEGATIVE Final   Comment:            The GeneXpert MRSA Assay (FDA     approved for NASAL specimens     only), is one component of a     comprehensive MRSA colonization     surveillance program. It is not     intended to diagnose MRSA     infection nor to guide or     monitor treatment for     MRSA infections.     Studies: Ct Head Wo Contrast  06/23/2014   CLINICAL DATA:  ALTERED MENTAL STATUS  EXAM: CT HEAD WITHOUT CONTRAST  CT CERVICAL SPINE WITHOUT CONTRAST  TECHNIQUE: Multidetector CT imaging of the head and cervical spine was performed following  the standard protocol without intravenous contrast. Multiplanar CT image reconstructions of the cervical spine were also generated.  COMPARISON:  Prior study from 12/04/2013  FINDINGS: CT HEAD FINDINGS  Atrophy with chronic microvascular ischemic changes present, stable from prior. There is no acute intracranial hemorrhage or infarct. No mass lesion or midline shift. Gray-white matter differentiation is well maintained. Ventricles are normal in size without evidence of hydrocephalus. CSF containing spaces are within normal limits. No extra-axial fluid collection.  The calvarium is intact.  Orbital soft tissues are within normal limits.  The paranasal sinuses and mastoid air cells are well pneumatized and free of fluid.  Scalp soft tissues are unremarkable.  CT CERVICAL SPINE FINDINGS  Reversal of the normal  cervical lordosis is present, which may in part be related to patient positioning. There is trace retrolisthesis of C4 on C5. Mild degenerative vertebral body height loss present at C4, C5, and C6. Severe degenerative disc disease present at the C4-5, C5-6, and C6-7 levels with prominent anterior osteophytic spurring. Normal C1-2 articulations are intact. No prevertebral soft tissue swelling. No acute fracture or listhesis.  Visualized soft tissues of the neck are within normal limits. Visualized lung apices are clear without evidence of apical pneumothorax.  IMPRESSION: CT BRAIN:  No acute intracranial process.  CT CERVICAL SPINE:  No acute traumatic injury within the cervical spine.   Electronically Signed   By: Jeannine Boga M.D.   On: 06/23/2014 00:13   Ct Cervical Spine Wo Contrast  06/23/2014   CLINICAL DATA:  ALTERED MENTAL STATUS  EXAM: CT HEAD WITHOUT CONTRAST  CT CERVICAL SPINE WITHOUT CONTRAST  TECHNIQUE: Multidetector CT imaging of the head and cervical spine was performed following the standard protocol without intravenous contrast. Multiplanar CT image reconstructions of the cervical spine were also generated.  COMPARISON:  Prior study from 12/04/2013  FINDINGS: CT HEAD FINDINGS  Atrophy with chronic microvascular ischemic changes present, stable from prior. There is no acute intracranial hemorrhage or infarct. No mass lesion or midline shift. Gray-white matter differentiation is well maintained. Ventricles are normal in size without evidence of hydrocephalus. CSF containing spaces are within normal limits. No extra-axial fluid collection.  The calvarium is intact.  Orbital soft tissues are within normal limits.  The paranasal sinuses and mastoid air cells are well pneumatized and free of fluid.  Scalp soft tissues are unremarkable.  CT CERVICAL SPINE FINDINGS  Reversal of the normal cervical lordosis is present, which may in part be related to patient positioning. There is trace  retrolisthesis of C4 on C5. Mild degenerative vertebral body height loss present at C4, C5, and C6. Severe degenerative disc disease present at the C4-5, C5-6, and C6-7 levels with prominent anterior osteophytic spurring. Normal C1-2 articulations are intact. No prevertebral soft tissue swelling. No acute fracture or listhesis.  Visualized soft tissues of the neck are within normal limits. Visualized lung apices are clear without evidence of apical pneumothorax.  IMPRESSION: CT BRAIN:  No acute intracranial process.  CT CERVICAL SPINE:  No acute traumatic injury within the cervical spine.   Electronically Signed   By: Jeannine Boga M.D.   On: 06/23/2014 00:13    Scheduled Meds: . antiseptic oral rinse  7 mL Mouth Rinse BID  . ARIPiprazole  5 mg Oral BID  . benztropine  0.5 mg Oral BID  . chlorhexidine  15 mL Mouth Rinse BID  . heparin  5,000 Units Subcutaneous 3 times per day  . insulin detemir  15 Units Subcutaneous  Daily  . ziprasidone  10 mg Intramuscular Once   Continuous Infusions: . sodium chloride Stopped (06/23/14 0920)  . dextrose 5 % and 0.45% NaCl 100 mL/hr at 06/24/14 0209  . insulin (NOVOLIN-R) infusion 2.4 Units/hr (06/24/14 0703)    Principal Problem:   Altered mental status Active Problems:   DKA (diabetic ketoacidoses)   Schizophrenia    Time spent: [facility] Pager [phone]. If 7PM-7AM, please contact night-coverage at [email or web address], password [facility] 06/24/2014, 11:34 AM  LOS: 2 days

## 2014-06-24 NOTE — Progress Notes (Signed)
Report received from nurse in 3S for a patient to be tranferred into 5w16

## 2014-06-25 DIAGNOSIS — F2089 Other schizophrenia: Secondary | ICD-10-CM

## 2014-06-25 LAB — GLUCOSE, CAPILLARY
Glucose-Capillary: 180 mg/dL — ABNORMAL HIGH (ref 70–99)
Glucose-Capillary: 144 mg/dL — ABNORMAL HIGH (ref 70–99)
Glucose-Capillary: 266 mg/dL — ABNORMAL HIGH (ref 70–99)
Glucose-Capillary: 153 mg/dL — ABNORMAL HIGH (ref 70–99)
Glucose-Capillary: 118 mg/dL — ABNORMAL HIGH (ref 70–99)

## 2014-06-25 MED ORDER — CANAGLIFLOZIN 300 MG PO TABS
300.0000 mg | ORAL_TABLET | Freq: Every day | ORAL | Status: DC
  Administered 2014-06-25 – 2014-06-29 (×5): 300 mg via ORAL
  Filled 2014-06-25 (×5): qty 1

## 2014-06-25 MED ORDER — METFORMIN HCL 500 MG PO TABS
1000.0000 mg | ORAL_TABLET | Freq: Two times a day (BID) | ORAL | Status: DC
  Administered 2014-06-25 – 2014-06-29 (×9): 1000 mg via ORAL
  Filled 2014-06-25 (×10): qty 2

## 2014-06-25 MED ORDER — LINAGLIPTIN 5 MG PO TABS
5.0000 mg | ORAL_TABLET | Freq: Every day | ORAL | Status: DC
  Administered 2014-06-25 – 2014-06-29 (×5): 5 mg via ORAL
  Filled 2014-06-25 (×6): qty 1

## 2014-06-25 MED ORDER — GLUCERNA SHAKE PO LIQD
237.0000 mL | Freq: Three times a day (TID) | ORAL | Status: DC
  Administered 2014-06-25 – 2014-06-29 (×12): 237 mL via ORAL

## 2014-06-25 NOTE — Progress Notes (Signed)
[facility] PROGRESS NOTE  Raymond Brooks WUX:[id number] DOB: 24-Apr-1946 DOA: 06/22/2014 PCP: Nicky Pugh, MD   HPI/Subjective: Patient alert x3, denies any new complaints.   Assessment/Plan: DKA/uncontrolled diabetes mellitus -DKA resolved- f/u on A1c ordered today - pt refusing to go home with insulin and asking for me to place him back on his home meds - will need to see what A1 c - will resume home oral meds and cut back on long acting insulin   Schizophrenia -Psychiatry consulted>> appreciate input, started on Abilify and Cogentin and recommended for inpatient admission - pt refusing inpatient admission - not on medications at home for this  Altered mental status -Secondary to above, improving clinically>>follow  Code Status: full Family Communication: none at bedside Disposition Plan: inpatient behavioral medicine admit   Consultants:  Psych  Procedures:  none  Antibiotics:  none   Objective: Filed Vitals:   06/25/14 1406  BP: 100/65  Pulse: 96  Temp: 98.1 F (36.7 C)  Resp: 18    Intake/Output Summary (Last 24 hours) at 06/25/14 1744 Last data filed at 06/25/14 1300  Gross per 24 hour  Intake   1202 ml  Output   1450 ml  Net   -248 ml   Filed Weights   06/23/14 0445  Weight: 63.7 kg (140 lb 6.9 oz)    Exam:  General: alert & oriented x 3In NAD; lips smacking Cardiovascular: RRR, nl S1 s2 Respiratory: CTAB* Abdomen: soft +BS NT/ND, no masses palpable Extremities: No cyanosis and no edema    Data Reviewed: Basic Metabolic Panel:  Recent Labs Lab 06/24/14 0116 06/24/14 0215 06/24/14 0636 06/24/14 0915 06/24/14 1059  NA 142 139 140 140 141  K 3.7 3.8 4.6 4.1 3.8  CL 107 107 106 109 108  CO2 21 17* 20 20 21   GLUCOSE 153* 138* 196* 128* 126*  BUN 14 14 13 12 12   CREATININE 1.05 0.93 0.96 0.96 0.97  CALCIUM 8.6 8.4 8.5 8.3* 8.4   Liver Function Tests:  Recent Labs Lab 06/22/14 2310  AST 14  ALT 14  ALKPHOS 75   BILITOT 0.4  PROT 7.7  ALBUMIN 3.8   No results found for this basename: LIPASE, AMYLASE,  in the last 168 hours No results found for this basename: AMMONIA,  in the last 168 hours CBC:  Recent Labs Lab 06/22/14 2310  WBC 8.2  NEUTROABS 5.9  HGB 14.1  HCT 42.6  MCV 81.0  PLT 220   Cardiac Enzymes:  Recent Labs Lab 06/22/14 2342  TROPONINI <0.30   BNP (last 3 results) No results found for this basename: PROBNP,  in the last 8760 hours CBG:  Recent Labs Lab 06/24/14 2330 06/25/14 0337 06/25/14 0757 06/25/14 1155 06/25/14 1645  GLUCAP 206* 180* 144* 266* 153*    Recent Results (from the past 240 hour(s))  MRSA PCR SCREENING     Status: None   Collection Time    06/23/14  5:38 AM      Result Value Ref Range Status   MRSA by PCR NEGATIVE  NEGATIVE Final   Comment:            The GeneXpert MRSA Assay (FDA     approved for NASAL specimens     only), is one component of a     comprehensive MRSA colonization     surveillance program. It is not     intended to diagnose MRSA     infection nor to guide or  monitor treatment for     MRSA infections.     Studies: No results found.  Scheduled Meds: . antiseptic oral rinse  7 mL Mouth Rinse BID  . ARIPiprazole  5 mg Oral BID  . benztropine  0.5 mg Oral BID  . chlorhexidine  15 mL Mouth Rinse BID  . feeding supplement (GLUCERNA SHAKE)  237 mL Oral TID BM  . heparin  5,000 Units Subcutaneous 3 times per day  . insulin aspart  0-15 Units Subcutaneous TID WC  . insulin detemir  15 Units Subcutaneous Daily  . ziprasidone  10 mg Intramuscular Once   Continuous Infusions:    Principal Problem:   Altered mental status Active Problems:   DKA (diabetic ketoacidoses)   Schizophrenia    Time spent: 62    [facility]  [facility] Pager [phone]. If 7PM-7AM, please contact night-coverage at [email or web address], password [facility] 06/25/2014, 5:44 PM  LOS: 3 days

## 2014-06-25 NOTE — Care Management Note (Addendum)
    Page 1 of 2   06/27/2014     4:14:50 PM CARE MANAGEMENT NOTE 06/27/2014  Patient:  Raymond Brooks, Raymond Brooks   Account Number:  [id number]  Date Initiated:  06/24/2014  Documentation initiated by:  Marvetta Gibbons  Subjective/Objective Assessment:   Pt admitted with AMS     Action/Plan:   PTA pt lived at home- psych consulted- family wants ALF or SNF due to non compliance and erratic and dangerous behaviors.   Anticipated DC Date:  06/29/2014   Anticipated DC Plan:  [place]  In-house referral  Clinical Social Worker      DC Planning Services  CM consult      [facility] Choice  HOME HEALTH   Choice offered to / List presented to:  C-1 Patient        [place] arranged  HH-1 RN      [place].   Status of service:  Completed, signed off Medicare Important Message given?  YES (If response is "NO", the following Medicare IM given date fields will be blank) Date Medicare IM given:  06/25/2014 Medicare IM given by:  Tomi Bamberger Date Additional Medicare IM given:   Additional Medicare IM given by:    Discharge Disposition:  [place]  Per UR Regulation:  Reviewed for med. necessity/level of care/duration of stay  If discussed at [place] of Stay Meetings, dates discussed:    Comments:  06/27/14 [place], BSN [phone] patient insulin has been changed to novolog 70/30 pen, which will be $90 and patient states he can afford,  MD states she has to dose his insulin so patient unable to dc today he will have to be dc this weekend.  [place] patient could not locate his car, CSW called police station to see if they had a record of the care, they did not.  CSW will give information to weekend CSW.  Patient stated wants to work with [facility] for [facility], referral made to [facility], Butch Penny notified.  Soc will begin 24-48 hrs post dc.  06/25/11 [place], BSN [phone] per psych note, patient family wants snf or ALF for noncompliance,  patient may be able to get into a ALF but doubt snf,  will refer to CSW.

## 2014-06-25 NOTE — Consult Note (Signed)
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 at 06/25/14 1343  . insulin aspart (novoLOG) injection 0-15 Units  0-15 Units Subcutaneous TID WC Sheila Oats, MD   8 Units at 06/25/14 1234  . insulin detemir (LEVEMIR) injection 15 Units  15 Units Subcutaneous Daily Sheila Oats, MD   15 Units at 06/25/14 0913  . ziprasidone (GEODON) injection 10 mg  10 mg Intramuscular Once Julianne Rice, MD        Psychiatric Specialty Exam: Physical Exam  ROS  Blood pressure 100/65, pulse 96, temperature 98.1 F (36.7 C), temperature source Oral, resp. rate 18, height 5' 9" (1.753 m), weight 63.7 kg (140 lb 6.9 oz), SpO2 100.00%.Body mass index is 20.73 kg/(m^2).  General Appearance: Disheveled and Guarded  Eye  Contact::  Good  Speech:  Clear and Coherent and  Slurred  Volume:  Increased  Mood:  Anxious, Depressed and Irritable  Affect:  Depressed, Labile and Restricted  Thought Process:  Disorganized  Orientation:  Full (Time, Place, and Person)  Thought Content:  WDL  Suicidal Thoughts:  No  Homicidal Thoughts:  No  Memory:  Immediate;   Fair Recent;   Fair  Judgement:  Intact  Insight:  Lacking  Psychomotor Activity:  Decreased  Concentration:  Fair  Recall:  [employer] of Knowledge:Fair  Language: Fair  Akathisia:  NA  Handed:  Right  AIMS (if indicated):     Assets:  Communication Skills Desire for Improvement Financial Resources/Insurance Housing Leisure Time Resilience Social Support Talents/Skills  Sleep:      Musculoskeletal: Strength & Muscle Tone: decreased Gait & Station: unsteady Patient leans: N/A  Treatment Plan Summary: Daily contact with patient to assess and evaluate symptoms and progress in treatment Medication management Continue Abilify 5 mg PO BID and may switch to Abilify maintana (depot) due to non compliance also Cogentin 0.5 mg PO BID for EPS Refer to case management regarding disposition plans, family wants ALF or SNF due to non compliance and erratic and dangerous behaviors.   ,JANARDHAHA R. 06/25/2014 2:50 PM

## 2014-06-25 NOTE — Progress Notes (Signed)
Note that patient was just in the hospital last week at [facility] and refused to be discharged on insulin.  Based on admit H&P patient needs to be on insulin to prevent Hyperglycemia.  Note possible plans for ALF or SNF.  Discussed with RN Vaughan Basta.  Adah Perl, RN, BC-ADM Inpatient Diabetes Coordinator Pager [phone]

## 2014-06-25 NOTE — Progress Notes (Signed)
INITIAL NUTRITION ASSESSMENT  DOCUMENTATION CODES Per approved criteria  -Not Applicable   INTERVENTION: Glucerna Shake po BID, each supplement provides 220 kcal and 10 grams of protein  NUTRITION DIAGNOSIS: Inadequate oral intake related to schizophrenia as evidenced by suspected wt loss.   Goal: Pt to meet >/= 90% of their estimated nutrition needs   Monitor:  Weight trends, po intake, acceptance of supplements, labs  Reason for Assessment: MST  68 y.o. male  Admitting Dx: Altered mental status  ASSESSMENT: 68 y.o. male h/o DM treated only with invokana he states as well has schizophrenia not on any medications. He was found today by the [facility] due to extremely erratic driving.  - Pt reports that he believes he has lost 20 lbs in 1 week. No weight history in chart to confirm wt loss. Pt says that he has been eating fine. His diet consists of TV dinners. Pt advised that TV dinners may not have enough calories and nutrients to maintain weight.   Na and K WNL CBG's: 144-266  Height: Ht Readings from Last 1 Encounters:  06/23/14 5\' 9"  (1.753 m)    Weight: Wt Readings from Last 1 Encounters:  06/23/14 140 lb 6.9 oz (63.7 kg)    Ideal Body Weight: 70.7 kg  % Ideal Body Weight: 90%  Wt Readings from Last 10 Encounters:  06/23/14 140 lb 6.9 oz (63.7 kg)   BMI:  Body mass index is 20.73 kg/(m^2).  Estimated Nutritional Needs: Kcal: 1600-1800 Protein: 80-90 g Fluid: >1.8 L/day  Skin: Intact  Diet Order: Carb Control  EDUCATION NEEDS: -Education needs addressed   Intake/Output Summary (Last 24 hours) at 06/25/14 1240 Last data filed at 06/25/14 1236  Gross per 24 hour  Intake 2080.25 ml  Output   1450 ml  Net 630.25 ml    Last BM: 8/11   Labs:   Recent Labs Lab 06/24/14 0636 06/24/14 0915 06/24/14 1059  NA 140 140 141  K 4.6 4.1 3.8  CL 106 109 108  CO2 20 20 21   BUN 13 12 12   CREATININE 0.96 0.96 0.97  CALCIUM 8.5 8.3* 8.4   GLUCOSE 196* 128* 126*    CBG (last 3)   Recent Labs  06/25/14 0337 06/25/14 0757 06/25/14 1155  GLUCAP 180* 144* 266*    Scheduled Meds: . antiseptic oral rinse  7 mL Mouth Rinse BID  . ARIPiprazole  5 mg Oral BID  . benztropine  0.5 mg Oral BID  . chlorhexidine  15 mL Mouth Rinse BID  . heparin  5,000 Units Subcutaneous 3 times per day  . insulin aspart  0-15 Units Subcutaneous TID WC  . insulin detemir  15 Units Subcutaneous Daily  . ziprasidone  10 mg Intramuscular Once    Continuous Infusions:   Past Medical History  Diagnosis Date  . Hypertension   . High cholesterol   . Type II diabetes mellitus   . Prostate cancer ~ 2010    "froze it"  . Schizophrenia     pt denies this hx on 06/24/2014    Past Surgical History  Procedure Laterality Date  . Prostate cryoablation  ~ 2010    Terrace Arabia RD, LDN

## 2014-06-25 NOTE — Progress Notes (Signed)
NURSING PROGRESS NOTE  Raymond Brooks [id number] Transfer Data: 06/25/2014 9:52 AM Attending Provider: Debbe Odea, MD YBW:LSLHT,DSKAJG, MD Code Status: FULL  Raymond Brooks is a 68 y.o. male patient transferred from 3S -No acute distress noted.  -No complaints of shortness of breath.  -No complaints of chest pain.   Cardiac Monitoring: Box # 09 in place. Cardiac monitor yields:normal sinus rhythm with BBB  Blood pressure 96/65, pulse 92, temperature 98.5 F (36.9 C), temperature source Oral, resp. rate 16, height 5\' 9"  (1.753 m), weight 63.7 kg (140 lb 6.9 oz), SpO2 98.00%.   IV Fluids:  IV in place, occlusive dsg intact without redness, IV cath Left antecubital, condition patent and no redness infusing normal saline.   Allergies:  Review of patient's allergies indicates not on file.  Past Medical History:   has a past medical history of Hypertension; High cholesterol; Type II diabetes mellitus; Prostate cancer (~ 2010); and Schizophrenia.  Past Surgical History:   has past surgical history that includes Prostate cryoablation (~ 2010).  Social History:   reports that he has quit smoking. His smoking use included Cigarettes. He has a 30 pack-year smoking history. He has never used smokeless tobacco. He reports that he drinks alcohol. He reports that he does not use illicit drugs.  Skin: scattered scabs to upper back and bilateral upper and lower extremities  Patient orientated to room. Information packet given to patient. Admission inpatient armband information verified with patient to include name and date of birth and placed on patient arm. Side rails up x 2, fall assessment and education completed with patient. Patient/family able to verbalize understanding of risk associated with falls and verbalized understanding to call for assistance before getting out of bed. Call light within reach. Patientable to voice and demonstrate understanding of unit orientation instructions.    Will  continue to evaluate and treat per MD orders.  Wallie Renshaw, RN

## 2014-06-25 NOTE — Clinical Social Work Psych Note (Signed)
Psych CSW spoke with pt mother to collect collateral information as per psychiatrist request.  Mother states that pt cares for himself in his own apartment in [place].  Mother reports that pt drives and transports her to church and to the grocery store.  Pt mother reports that pt refuses to take ANY medication.  Pt mother reports that pt will pay for medicine and never take it.  Pt mother reports that pt has been hospitalized last year in [place] for dx of schizophrenia.  Pt mother reports that pt also has hx of cancer of which he has had an operation "years ago".  Of report, pt and pt family from [place].  Pt mother reports pt likes to be by himself and alone.  Pt has two daughters-relationship is estranged.  Pt mother reports that pt needs someone to "force him to take his medication".  Psych CSW provided psychoeducation surrounding this issue and the inability to force medication.  Pt mother is 68 years old and voices frustration regarding pt being non-compliant.  Psych CSW provided support.  Pt mother in poor health (reports uncontrolled diabetes) and has recently been dc'd from the hospital.  Pt mother reports that pt only has PCP in [place]- no [place] provider.  Nonnie Done, [place] [phone]  Clinical Social Work

## 2014-06-26 LAB — GLUCOSE, CAPILLARY
GLUCOSE-CAPILLARY: 200 mg/dL — AB (ref 70–99)
GLUCOSE-CAPILLARY: 343 mg/dL — AB (ref 70–99)
GLUCOSE-CAPILLARY: 156 mg/dL — AB (ref 70–99)
Glucose-Capillary: 154 mg/dL — ABNORMAL HIGH (ref 70–99)

## 2014-06-26 LAB — HEMOGLOBIN A1C
Hgb A1c MFr Bld: 18.8 % — ABNORMAL HIGH (ref <5.7)
Mean Plasma Glucose: 493 mg/dL — ABNORMAL HIGH (ref <117)

## 2014-06-26 MED ORDER — PIOGLITAZONE HCL 45 MG PO TABS: 45.0000 mg | ORAL_TABLET | Freq: Every day | ORAL | Status: DC

## 2014-06-26 MED ORDER — ARIPIPRAZOLE 5 MG PO TABS
5.0000 mg | ORAL_TABLET | Freq: Two times a day (BID) | ORAL | Status: AC
Start: 2014-06-26 — End: ?

## 2014-06-26 MED ORDER — INSULIN ASPART 100 UNIT/ML ~~LOC~~ SOLN
5.0000 [IU] | Freq: Three times a day (TID) | SUBCUTANEOUS | Status: DC
  Administered 2014-06-27 (×2): 5 [IU] via SUBCUTANEOUS

## 2014-06-26 MED ORDER — BENZTROPINE MESYLATE 0.5 MG PO TABS: 0.5000 mg | ORAL_TABLET | Freq: Two times a day (BID) | ORAL | Status: AC

## 2014-06-26 MED ORDER — INSULIN DETEMIR 100 UNIT/ML ~~LOC~~ SOLN
15.0000 [IU] | Freq: Every day | SUBCUTANEOUS | Status: DC
Start: 2014-06-26 — End: 2014-06-26
  Filled 2014-06-26: qty 0.15

## 2014-06-26 MED ORDER — INSULIN ASPART 100 UNIT/ML ~~LOC~~ SOLN
0.0000 [IU] | Freq: Three times a day (TID) | SUBCUTANEOUS | Status: DC
  Administered 2014-06-27: 1 [IU] via SUBCUTANEOUS
  Administered 2014-06-28 – 2014-06-29 (×4): 2 [IU] via SUBCUTANEOUS

## 2014-06-26 MED ORDER — GLIMEPIRIDE 4 MG PO TABS: 4.0000 mg | ORAL_TABLET | Freq: Every day | ORAL | Status: DC

## 2014-06-26 MED ORDER — INSULIN STARTER KIT- SYRINGES (ENGLISH)
1.0000 | Freq: Once | Status: AC
  Administered 2014-06-26: 1
  Filled 2014-06-26 (×2): qty 1

## 2014-06-26 MED ORDER — INSULIN ASPART 100 UNIT/ML ~~LOC~~ SOLN: 0.0000 [IU] | Freq: Every day | SUBCUTANEOUS | Status: DC

## 2014-06-26 MED ORDER — INSULIN DETEMIR 100 UNIT/ML ~~LOC~~ SOLN: 15.0000 [IU] | Freq: Every day | SUBCUTANEOUS | Status: DC

## 2014-06-26 MED ORDER — GLIMEPIRIDE 4 MG PO TABS
4.0000 mg | ORAL_TABLET | Freq: Every day | ORAL | Status: DC
  Filled 2014-06-26: qty 1

## 2014-06-26 MED ORDER — INSULIN DETEMIR 100 UNIT/ML ~~LOC~~ SOLN
20.0000 [IU] | Freq: Every day | SUBCUTANEOUS | Status: DC
  Administered 2014-06-26 – 2014-06-27 (×2): 20 [IU] via SUBCUTANEOUS
  Filled 2014-06-26 (×2): qty 0.2

## 2014-06-26 NOTE — Progress Notes (Signed)
Inpatient Diabetes Program Recommendations  AACE/ADA: New Consensus Statement on Inpatient Glycemic Control (2013)  Target Ranges:  Prepandial:   less than 140 mg/dL      Peak postprandial:   less than 180 mg/dL (1-2 hours)      Critically ill patients:  140 - 180 mg/dL   Reason for Assessment:   Results for SAW, MENDENHALL (MRN [id number]) as of 06/26/2014 10:29  Ref. Range 06/25/2014 10:59  Mean Plasma Glucose Latest Range: <117 mg/dL 493 (H)  Hemoglobin A1C Latest Range: <5.7 % 18.8 (H)    Called and discussed with Dr. Wynelle Cleveland.  According to patient's A1C, he will need insulin at discharge.  Recommend discontinuation of Invokana, due to increased risk of DKA due to presence of acidosis on admit.  Needs discharge planning related to insulin administration.  Thanks, Adah Perl, RN, BC-ADM Inpatient Diabetes Coordinator Pager [phone]

## 2014-06-26 NOTE — Consult Note (Signed)
[facility] Face-to-Face Psychiatry Consult   Reason for Consult:  Capacity evaluation Referring Physician:  Dr. Gae Gallop [name] is an 68 y.o. male. Total Time spent with patient: 45 minutes  Assessment: AXIS I:  Schizoaffective Disorder AXIS II:  Deferred AXIS III:   Past Medical History  Diagnosis Date  . Hypertension   . High cholesterol   . Type II diabetes mellitus   . Prostate cancer ~ 2010    "froze it"  . Schizophrenia     pt denies this hx on 06/24/2014   AXIS IV:  other psychosocial or environmental problems, problems related to social environment and problems with primary support group AXIS V:  41-50 serious symptoms  Plan:  Patient has capacity to make his own medical decisions and living arrangement Patient is tolerating his medication and positively responding No evidence of imminent risk to self or others at present.   Patient does not meet criteria for psychiatric inpatient admission. Supportive therapy provided about ongoing stressors. Discussed crisis plan, support from social network, calling 911, coming to the Emergency Department, and calling Suicide Hotline. Refer to out patient psychiatric services Appreciate psychiatric consultation Please contact [phone] if needs further assistance  Subjective:   Raymond Brooks is a 68 y.o. male patient admitted with psychosis.  HPI:  Patient is seen, chart reviewed and case discussed with staff RN and Pharmacy tech who is trying to obtain information from patient pharmacy. Patient appeared staying in his bed, awake, alert and partially oriented. He stated that he lost himself while driving home. He does not remember what happened after that. Patient lives alone and his mother is his [place].  His mother has unknown health problems and currently staying in hospital. His sister has limited information about him. He has abnormal tongue movement like frequently smacking his lips during this evaluation. He is poor historian and  he  denied mental health history in person or in the family. He has denied suicidal or homicidal ideation. Will ask social service to obtain psychosocial information and collateral from family and other providers. As per pharmacy info Prior Rx's at [facility] drug store for Flupehnazine 5 mg BID, last filled 09/06/13. He was on Fluphenazine decanaote 50 mg every 2 weeks back in 2010 -2011, and also Olanzapine 20 mg qhs in 2012. Based on his DM he may not be a good candidate to restart Olanzapine and may start Abilify 5 mg PO BID for controlling his erratic and paranoid behaviors and Cogentin 0. 5 mg BID for EPS until we obtain further information.   Interval History: Patient is calm and cooperative. He has denied current symptoms of psychosis, irritability, agitation, anger out burst and denied paranoid delusions and hallucinations. Patient state that he has no suicidal or homicidal ideations. He has been living by himself for a long time and wishes to live like that. He has intact cognition and has understood about his mental and physical condition and treatment needs and willing to be compliant with medications and follow up with out patient care. Patient stated that his psychiatrist is aware of him being off of his previous psych medication and has no reported relapse of psychosis.  Patient benefit from out of home placement either at ALF or SNF upon discharge and has no criteria for acute psych in patient treatment.   HPI Elements:  Location:  chronic schizophrenia. Quality:  moderate. Severity:  moderate. Timing:  non compliance.  Past Psychiatric History: Past Medical History  Diagnosis Date  .  Hypertension   . High cholesterol   . Type II diabetes mellitus   . Prostate cancer ~ 2010    "froze it"  . Schizophrenia     pt denies this hx on 06/24/2014    reports that he has quit smoking. His smoking use included Cigarettes. He has a 30 pack-year smoking history. He has never used smokeless tobacco.  He reports that he drinks alcohol. He reports that he does not use illicit drugs. History reviewed. No pertinent family history.   Allergies:  Not on File  ACT Assessment Complete:  No Objective: Blood pressure 93/63, pulse 98, temperature 98.5 F (36.9 C), temperature source Oral, resp. rate 18, height $RemoveBe'5\' 9"'rYJffCGJz$  (1.753 m), weight 63.7 kg (140 lb 6.9 oz), SpO2 98.00%.Body mass index is 20.73 kg/(m^2). Results for orders placed during the hospital encounter of 06/22/14 (from the past 72 hour(s))  GLUCOSE, CAPILLARY     Status: Abnormal   Collection Time    06/23/14  1:30 PM      Result Value Ref Range   Glucose-Capillary 210 (*) 70 - 99 mg/dL   Comment 1 Notify RN     Comment 2 Documented in Chart    GLUCOSE, CAPILLARY     Status: Abnormal   Collection Time    06/23/14  2:47 PM      Result Value Ref Range   Glucose-Capillary 186 (*) 70 - 99 mg/dL   Comment 1 Notify RN     Comment 2 Documented in Chart    GLUCOSE, CAPILLARY     Status: Abnormal   Collection Time    06/23/14  3:54 PM      Result Value Ref Range   Glucose-Capillary 127 (*) 70 - 99 mg/dL   Comment 1 Notify RN    GLUCOSE, CAPILLARY     Status: Abnormal   Collection Time    06/23/14  4:56 PM      Result Value Ref Range   Glucose-Capillary 128 (*) 70 - 99 mg/dL   Comment 1 Notify RN     Comment 2 Documented in Chart    GLUCOSE, CAPILLARY     Status: Abnormal   Collection Time    06/23/14  6:02 PM      Result Value Ref Range   Glucose-Capillary 110 (*) 70 - 99 mg/dL   Comment 1 Notify RN     Comment 2 Documented in Chart    GLUCOSE, CAPILLARY     Status: Abnormal   Collection Time    06/23/14  6:56 PM      Result Value Ref Range   Glucose-Capillary 141 (*) 70 - 99 mg/dL   Comment 1 Notify RN     Comment 2 Documented in Chart    GLUCOSE, CAPILLARY     Status: Abnormal   Collection Time    06/23/14  7:56 PM      Result Value Ref Range   Glucose-Capillary 153 (*) 70 - 99 mg/dL   Comment 1 Documented in Chart      Comment 2 Notify RN    BASIC METABOLIC PANEL     Status: Abnormal   Collection Time    06/23/14  8:15 PM      Result Value Ref Range   Sodium 159 (*) 137 - 147 mEq/L   Potassium 4.0  3.7 - 5.3 mEq/L   Chloride 92 (*) 96 - 112 mEq/L   CO2 18 (*) 19 - 32 mEq/L   Glucose, Bld 164 (*)  70 - 99 mg/dL   BUN 16  6 - 23 mg/dL   Creatinine, Ser 0.95  0.50 - 1.35 mg/dL   Calcium 8.5  8.4 - 10.5 mg/dL   GFR calc non Af Amer 85 (*) >90 mL/min   GFR calc Af Amer >90  >90 mL/min   Comment: (NOTE)     The eGFR has been calculated using the CKD EPI equation.     This calculation has not been validated in all clinical situations.     eGFR's persistently <90 mL/min signify possible Chronic Kidney     Disease.   Anion gap 49 (*) 5 - 15  GLUCOSE, CAPILLARY     Status: Abnormal   Collection Time    06/23/14  8:53 PM      Result Value Ref Range   Glucose-Capillary 158 (*) 70 - 99 mg/dL   Comment 1 Documented in Chart     Comment 2 Notify RN    GLUCOSE, CAPILLARY     Status: Abnormal   Collection Time    06/23/14  9:58 PM      Result Value Ref Range   Glucose-Capillary 150 (*) 70 - 99 mg/dL   Comment 1 Documented in Chart     Comment 2 Notify RN    BASIC METABOLIC PANEL     Status: Abnormal   Collection Time    06/23/14 10:32 PM      Result Value Ref Range   Sodium 141  137 - 147 mEq/L   Comment: DELTA CHECK NOTED   Potassium 3.5 (*) 3.7 - 5.3 mEq/L   Chloride 108  96 - 112 mEq/L   CO2 20  19 - 32 mEq/L   Glucose, Bld 164 (*) 70 - 99 mg/dL   BUN 15  6 - 23 mg/dL   Creatinine, Ser 0.96  0.50 - 1.35 mg/dL   Calcium 8.5  8.4 - 10.5 mg/dL   GFR calc non Af Amer 84 (*) >90 mL/min   GFR calc Af Amer >90  >90 mL/min   Comment: (NOTE)     The eGFR has been calculated using the CKD EPI equation.     This calculation has not been validated in all clinical situations.     eGFR's persistently <90 mL/min signify possible Chronic Kidney     Disease.   Anion gap 13  5 - 15  GLUCOSE, CAPILLARY      Status: Abnormal   Collection Time    06/23/14 11:01 PM      Result Value Ref Range   Glucose-Capillary 159 (*) 70 - 99 mg/dL   Comment 1 Documented in Chart     Comment 2 Notify RN    GLUCOSE, CAPILLARY     Status: Abnormal   Collection Time    06/24/14 12:09 AM      Result Value Ref Range   Glucose-Capillary 135 (*) 70 - 99 mg/dL  GLUCOSE, CAPILLARY     Status: Abnormal   Collection Time    06/24/14  1:01 AM      Result Value Ref Range   Glucose-Capillary 145 (*) 70 - 99 mg/dL  BASIC METABOLIC PANEL     Status: Abnormal   Collection Time    06/24/14  1:16 AM      Result Value Ref Range   Sodium 142  137 - 147 mEq/L   Potassium 3.7  3.7 - 5.3 mEq/L   Chloride 107  96 - 112 mEq/L   CO2 21  19 - 32 mEq/L   Glucose, Bld 153 (*) 70 - 99 mg/dL   BUN 14  6 - 23 mg/dL   Creatinine, Ser 1.05  0.50 - 1.35 mg/dL   Calcium 8.6  8.4 - 10.5 mg/dL   GFR calc non Af Amer 72 (*) >90 mL/min   GFR calc Af Amer 83 (*) >90 mL/min   Comment: (NOTE)     The eGFR has been calculated using the CKD EPI equation.     This calculation has not been validated in all clinical situations.     eGFR's persistently <90 mL/min signify possible Chronic Kidney     Disease.   Anion gap 14  5 - 15  GLUCOSE, CAPILLARY     Status: Abnormal   Collection Time    06/24/14  2:05 AM      Result Value Ref Range   Glucose-Capillary 119 (*) 70 - 99 mg/dL  BASIC METABOLIC PANEL     Status: Abnormal   Collection Time    06/24/14  2:15 AM      Result Value Ref Range   Sodium 139  137 - 147 mEq/L   Potassium 3.8  3.7 - 5.3 mEq/L   Chloride 107  96 - 112 mEq/L   CO2 17 (*) 19 - 32 mEq/L   Glucose, Bld 138 (*) 70 - 99 mg/dL   BUN 14  6 - 23 mg/dL   Creatinine, Ser 0.93  0.50 - 1.35 mg/dL   Calcium 8.4  8.4 - 10.5 mg/dL   GFR calc non Af Amer 86 (*) >90 mL/min   GFR calc Af Amer >90  >90 mL/min   Comment: (NOTE)     The eGFR has been calculated using the CKD EPI equation.     This calculation has not been  validated in all clinical situations.     eGFR's persistently <90 mL/min signify possible Chronic Kidney     Disease.   Anion gap 15  5 - 15  GLUCOSE, CAPILLARY     Status: Abnormal   Collection Time    06/24/14  2:57 AM      Result Value Ref Range   Glucose-Capillary 151 (*) 70 - 99 mg/dL  GLUCOSE, CAPILLARY     Status: Abnormal   Collection Time    06/24/14  4:03 AM      Result Value Ref Range   Glucose-Capillary 170 (*) 70 - 99 mg/dL  GLUCOSE, CAPILLARY     Status: Abnormal   Collection Time    06/24/14  4:57 AM      Result Value Ref Range   Glucose-Capillary 180 (*) 70 - 99 mg/dL  GLUCOSE, CAPILLARY     Status: Abnormal   Collection Time    06/24/14  6:08 AM      Result Value Ref Range   Glucose-Capillary 180 (*) 70 - 99 mg/dL  BASIC METABOLIC PANEL     Status: Abnormal   Collection Time    06/24/14  6:36 AM      Result Value Ref Range   Sodium 140  137 - 147 mEq/L   Potassium 4.6  3.7 - 5.3 mEq/L   Comment: HEMOLYZED SPECIMEN, RESULTS MAY BE AFFECTED   Chloride 106  96 - 112 mEq/L   CO2 20  19 - 32 mEq/L   Glucose, Bld 196 (*) 70 - 99 mg/dL   BUN 13  6 - 23 mg/dL   Creatinine, Ser 0.96  0.50 - 1.35 mg/dL  Calcium 8.5  8.4 - 10.5 mg/dL   GFR calc non Af Amer 84 (*) >90 mL/min   GFR calc Af Amer >90  >90 mL/min   Comment: (NOTE)     The eGFR has been calculated using the CKD EPI equation.     This calculation has not been validated in all clinical situations.     eGFR's persistently <90 mL/min signify possible Chronic Kidney     Disease.   Anion gap 14  5 - 15  GLUCOSE, CAPILLARY     Status: Abnormal   Collection Time    06/24/14  7:00 AM      Result Value Ref Range   Glucose-Capillary 182 (*) 70 - 99 mg/dL  GLUCOSE, CAPILLARY     Status: Abnormal   Collection Time    06/24/14  8:03 AM      Result Value Ref Range   Glucose-Capillary 177 (*) 70 - 99 mg/dL  GLUCOSE, CAPILLARY     Status: Abnormal   Collection Time    06/24/14  8:57 AM      Result Value Ref  Range   Glucose-Capillary 119 (*) 70 - 99 mg/dL   Comment 1 Notify RN     Comment 2 Documented in Chart    BASIC METABOLIC PANEL     Status: Abnormal   Collection Time    06/24/14  9:15 AM      Result Value Ref Range   Sodium 140  137 - 147 mEq/L   Potassium 4.1  3.7 - 5.3 mEq/L   Comment: HEMOLYSIS AT THIS LEVEL MAY AFFECT RESULT   Chloride 109  96 - 112 mEq/L   CO2 20  19 - 32 mEq/L   Glucose, Bld 128 (*) 70 - 99 mg/dL   BUN 12  6 - 23 mg/dL   Creatinine, Ser 0.96  0.50 - 1.35 mg/dL   Calcium 8.3 (*) 8.4 - 10.5 mg/dL   GFR calc non Af Amer 84 (*) >90 mL/min   GFR calc Af Amer >90  >90 mL/min   Comment: (NOTE)     The eGFR has been calculated using the CKD EPI equation.     This calculation has not been validated in all clinical situations.     eGFR's persistently <90 mL/min signify possible Chronic Kidney     Disease.   Anion gap 11  5 - 15  GLUCOSE, CAPILLARY     Status: Abnormal   Collection Time    06/24/14 10:24 AM      Result Value Ref Range   Glucose-Capillary 121 (*) 70 - 99 mg/dL  BASIC METABOLIC PANEL     Status: Abnormal   Collection Time    06/24/14 10:59 AM      Result Value Ref Range   Sodium 141  137 - 147 mEq/L   Potassium 3.8  3.7 - 5.3 mEq/L   Chloride 108  96 - 112 mEq/L   CO2 21  19 - 32 mEq/L   Glucose, Bld 126 (*) 70 - 99 mg/dL   BUN 12  6 - 23 mg/dL   Creatinine, Ser 0.97  0.50 - 1.35 mg/dL   Calcium 8.4  8.4 - 10.5 mg/dL   GFR calc non Af Amer 84 (*) >90 mL/min   GFR calc Af Amer >90  >90 mL/min   Comment: (NOTE)     The eGFR has been calculated using the CKD EPI equation.     This calculation has not been validated  in all clinical situations.     eGFR's persistently <90 mL/min signify possible Chronic Kidney     Disease.   Anion gap 12  5 - 15  GLUCOSE, CAPILLARY     Status: Abnormal   Collection Time    06/24/14 11:24 AM      Result Value Ref Range   Glucose-Capillary 136 (*) 70 - 99 mg/dL  GLUCOSE, CAPILLARY     Status: Abnormal    Collection Time    06/24/14  4:16 PM      Result Value Ref Range   Glucose-Capillary 221 (*) 70 - 99 mg/dL  GLUCOSE, CAPILLARY     Status: Abnormal   Collection Time    06/24/14 10:47 PM      Result Value Ref Range   Glucose-Capillary 177 (*) 70 - 99 mg/dL  GLUCOSE, CAPILLARY     Status: Abnormal   Collection Time    06/24/14 11:30 PM      Result Value Ref Range   Glucose-Capillary 206 (*) 70 - 99 mg/dL  GLUCOSE, CAPILLARY     Status: Abnormal   Collection Time    06/25/14  3:37 AM      Result Value Ref Range   Glucose-Capillary 180 (*) 70 - 99 mg/dL  GLUCOSE, CAPILLARY     Status: Abnormal   Collection Time    06/25/14  7:57 AM      Result Value Ref Range   Glucose-Capillary 144 (*) 70 - 99 mg/dL  HEMOGLOBIN A1C     Status: Abnormal   Collection Time    06/25/14 10:59 AM      Result Value Ref Range   Hemoglobin A1C 18.8 (*) <5.7 %   Comment: (NOTE)                                                                               According to the ADA Clinical Practice Recommendations for 2011, when     HbA1c is used as a screening test:      >=6.5%   Diagnostic of Diabetes Mellitus               (if abnormal result is confirmed)     5.7-6.4%   Increased risk of developing Diabetes Mellitus     References:Diagnosis and Classification of Diabetes Mellitus,Diabetes     ZOXW,9604,54(UJWJX 1):S62-S69 and Standards of Medical Care in             Diabetes - 2011,Diabetes Care,2011,34 (Suppl 1):S11-S61.   Mean Plasma Glucose 493 (*) <117 mg/dL   Comment: Performed at [place], CAPILLARY     Status: Abnormal   Collection Time    06/25/14 11:55 AM      Result Value Ref Range   Glucose-Capillary 266 (*) 70 - 99 mg/dL  GLUCOSE, CAPILLARY     Status: Abnormal   Collection Time    06/25/14  4:45 PM      Result Value Ref Range   Glucose-Capillary 153 (*) 70 - 99 mg/dL  GLUCOSE, CAPILLARY     Status: Abnormal   Collection Time    06/25/14  9:10 PM      Result  Value Ref  Range   Glucose-Capillary 118 (*) 70 - 99 mg/dL  GLUCOSE, CAPILLARY     Status: Abnormal   Collection Time    06/26/14  7:39 AM      Result Value Ref Range   Glucose-Capillary 200 (*) 70 - 99 mg/dL  GLUCOSE, CAPILLARY     Status: Abnormal   Collection Time    06/26/14 11:39 AM      Result Value Ref Range   Glucose-Capillary 343 (*) 70 - 99 mg/dL   Labs are reviewed and are pertinent for as above.  Current Facility-Administered Medications  Medication Dose Route Frequency Provider Last Rate Last Dose  . antiseptic oral rinse (CPC / CETYLPYRIDINIUM CHLORIDE 0.05%) solution 7 mL  7 mL Mouth Rinse BID Adeline C Viyuoh, MD   7 mL at 06/26/14 1000  . ARIPiprazole (ABILIFY) tablet 5 mg  5 mg Oral BID Durward Parcel, MD   5 mg at 06/26/14 1048  . benztropine (COGENTIN) tablet 0.5 mg  0.5 mg Oral BID Durward Parcel, MD   0.5 mg at 06/26/14 1048  . Canagliflozin TABS 300 mg  300 mg Oral Daily Debbe Odea, MD   300 mg at 06/26/14 1048  . chlorhexidine (PERIDEX) 0.12 % solution 15 mL  15 mL Mouth Rinse BID Adeline C Viyuoh, MD   15 mL at 06/26/14 1049  . dextrose 50 % solution 25 mL  25 mL Intravenous PRN Etta Quill, DO      . feeding supplement (GLUCERNA SHAKE) (GLUCERNA SHAKE) liquid 237 mL  237 mL Oral TID BM Dagmar Hait, RD   237 mL at 06/26/14 1000  . heparin injection 5,000 Units  5,000 Units Subcutaneous 3 times per day Etta Quill, DO   5,000 Units at 06/26/14 0601  . insulin aspart (novoLOG) injection 0-15 Units  0-15 Units Subcutaneous TID WC Sheila Oats, MD   11 Units at 06/26/14 1235  . insulin detemir (LEVEMIR) injection 20 Units  20 Units Subcutaneous Daily Saima Rizwan, MD      . insulin starter kit- syringes (English) 1 kit  1 kit Other Once Debbe Odea, MD      . linagliptin (TRADJENTA) tablet 5 mg  5 mg Oral Daily Debbe Odea, MD   5 mg at 06/26/14 1048  . metFORMIN (GLUCOPHAGE) tablet 1,000 mg  1,000 mg Oral BID WC Debbe Odea,  MD   1,000 mg at 06/26/14 0815  . ziprasidone (GEODON) injection 10 mg  10 mg Intramuscular Once Julianne Rice, MD        Psychiatric Specialty Exam: Physical Exam  ROS  Blood pressure 93/63, pulse 98, temperature 98.5 F (36.9 C), temperature source Oral, resp. rate 18, height $RemoveBe'5\' 9"'pIJIFvQOQ$  (1.753 m), weight 63.7 kg (140 lb 6.9 oz), SpO2 98.00%.Body mass index is 20.73 kg/(m^2).  General Appearance: Casual  Eye Contact::  Good  Speech:  Clear and Coherent  Volume:  Normal  Mood:  Euthymic  Affect:  Appropriate and Congruent  Thought Process:  Coherent and Goal Directed  Orientation:  Full (Time, Place, and Person)  Thought Content:  WDL  Suicidal Thoughts:  No  Homicidal Thoughts:  No  Memory:  Immediate;   Fair Recent;   Fair  Judgement:  Intact  Insight:  Fair  Psychomotor Activity:  Normal  Concentration:  Fair  Recall:  [employer] of Knowledge:Fair  Language: Fair  Akathisia:  NA  Handed:  Right  AIMS (if indicated):     Assets:  Communication Skills Desire for Improvement Financial Resources/Insurance Housing Leisure Time Resilience Social Support Talents/Skills  Sleep:      Musculoskeletal: Strength & Muscle Tone: decreased Gait & Station: unsteady Patient leans: N/A  Treatment Plan Summary: Daily contact with patient to assess and evaluate symptoms and progress in treatment Medication management Continue Abilify 5 mg PO BID and Cogentin 0.5 mg PO BID for EPS Patient has capacity to make his own medical decisions and living arrangement Patient will be referred to out patient psychiatric treatment after medically cleared Patient refuses in patient psych treatment and also ALF or SNF as his family wants.   Raymond Brooks,JANARDHAHA R. 06/26/2014 1:15 PM

## 2014-06-26 NOTE — Progress Notes (Signed)
Inpatient Diabetes Program Recommendations  AACE/ADA: New Consensus Statement on Inpatient Glycemic Control (2013)  Target Ranges:  Prepandial:   less than 140 mg/dL      Peak postprandial:   less than 180 mg/dL (1-2 hours)      Critically ill patients:  140 - 180 mg/dL   Ordered insulin starter kit and insulin teaching by bedside RN.  Discussed insulin administration with patient and he states"I'll do it if I have too".  Discussed with bedside RN.  Encouraged her to teach patient insulin administration with syringe first so that he can stick himself.  Diabetes Coordinator will reassess him 8/14.  RN states that MD wondered whether he could try insulin pen.  Will follow-up insulin teaching and demonstrate use of pen on 8/14.  Asked RN to please document patient's progress and ability to self-administer insulin. Patient does not have glucose meter, so will need Rx. For meter at discharge.  If patient does go home will need home health nurse to follow-up on diabetes teaching.    Will follow.  Adah Perl, RN, BC-ADM Inpatient Diabetes Coordinator Pager [phone]

## 2014-06-26 NOTE — Progress Notes (Addendum)
[facility] PROGRESS NOTE  Raymond Brooks VEL:[id number] DOB: 1946-02-20 DOA: 06/22/2014 PCP: Nicky Pugh, MD   HPI/Subjective: Patient alert x3, denies any new complaints.   Assessment/Plan: DKA/uncontrolled diabetes mellitus -DKA resolved - A1c significantly elevated at 18.8 - pt quite hesitant to start Insulin but with much convincing ultimately agreed to try it.  - Will start Levimir at 20 U QHS with mealtime Novolog at 5 U TID   Schizophrenia -Psychiatry consulted>> appreciate input, started on Abilify and Cogentin and initially recommended for inpatient admission - pt refusing inpatient admission - psych has decided that he is stable enough at this point to be discharged home.  - per psych he has capacity to make appropriate decisions for himself  Altered mental status -Secondary to above, improved clinically>>follow  Code Status: full Family Communication: with mother today Disposition Plan: home in 1--2 days   Consultants:  Psych  Procedures:  none  Antibiotics:  none   Objective: Filed Vitals:   06/26/14 1340  BP: 99/66  Pulse: 98  Temp: 97.5 F (36.4 C)  Resp: 18    Intake/Output Summary (Last 24 hours) at 06/26/14 1407 Last data filed at 06/26/14 1342  Gross per 24 hour  Intake   1320 ml  Output    580 ml  Net    740 ml   Filed Weights   06/23/14 0445  Weight: 63.7 kg (140 lb 6.9 oz)    Exam:  General: alert & oriented x 3In NAD; tongue rolling and lip smacking movements Cardiovascular: RRR, nl S1 s2 Respiratory: CTAB* Abdomen: soft +BS NT/ND, no masses palpable Extremities: No cyanosis and no edema    Data Reviewed: Basic Metabolic Panel:  Recent Labs Lab 06/24/14 0116 06/24/14 0215 06/24/14 0636 06/24/14 0915 06/24/14 1059  NA 142 139 140 140 141  K 3.7 3.8 4.6 4.1 3.8  CL 107 107 106 109 108  CO2 21 17* _0 GLUCOSE 153* 138* 196* 128* 126*  BUN _1 CREATININE 1.05 0.93 0.96 0.96 0.97  CALCIUM  8.6 8.4 8.5 8.3* 8.4   Liver Function Tests:  Recent Labs Lab 06/22/14 2310  AST 14  ALT 14  ALKPHOS 75  BILITOT 0.4  PROT 7.7  ALBUMIN 3.8   No results found for this basename: LIPASE, AMYLASE,  in the last 168 hours No results found for this basename: AMMONIA,  in the last 168 hours CBC:  Recent Labs Lab 06/22/14 2310  WBC 8.2  NEUTROABS 5.9  HGB 14.1  HCT 42.6  MCV 81.0  PLT 220   Cardiac Enzymes:  Recent Labs Lab 06/22/14 2342  TROPONINI <0.30   BNP (last 3 results) No results found for this basename: PROBNP,  in the last 8760 hours CBG:  Recent Labs Lab 06/25/14 1155 06/25/14 1645 06/25/14 2110 06/26/14 0739 06/26/14 1139  GLUCAP 266* 153* 118* 200* 343*    Recent Results (from the past 240 hour(s))  MRSA PCR SCREENING     Status: None   Collection Time    06/23/14  5:38 AM      Result Value Ref Range Status   MRSA by PCR NEGATIVE  NEGATIVE Final   Comment:            The GeneXpert MRSA Assay (FDA     approved for NASAL specimens     only), is one component of a     comprehensive MRSA colonization     surveillance program. It is not  intended to diagnose MRSA     infection nor to guide or     monitor treatment for     MRSA infections.     Studies: No results found.  Scheduled Meds: . antiseptic oral rinse  7 mL Mouth Rinse BID  . ARIPiprazole  5 mg Oral BID  . benztropine  0.5 mg Oral BID  . Canagliflozin  300 mg Oral Daily  . chlorhexidine  15 mL Mouth Rinse BID  . feeding supplement (GLUCERNA SHAKE)  237 mL Oral TID BM  . heparin  5,000 Units Subcutaneous 3 times per day  . insulin aspart  0-15 Units Subcutaneous TID WC  . insulin detemir  20 Units Subcutaneous Daily  . insulin starter kit- syringes  1 kit Other Once  . linagliptin  5 mg Oral Daily  . metFORMIN  1,000 mg Oral BID WC  . ziprasidone  10 mg Intramuscular Once   Continuous Infusions:       Time spent: 47    [facility]  [facility] Pager  [phone]. If 7PM-7AM, please contact night-coverage at [email or web address], password [facility] 06/26/2014, 2:07 PM  LOS: 4 days

## 2014-06-26 NOTE — Progress Notes (Signed)
Patient discussed with Raymond Brooks, Raymond Brooks Worker who is currently following patient with psychiatrist. She stated that patient will return home when stable per MD and hospital psychiatrist has determined that patient has capacity.  This CSW will sign as Psych SW is following patient.  Raymond Brooks. Raymond Brooks, Raymond Brooks

## 2014-06-27 LAB — GLUCOSE, CAPILLARY
Glucose-Capillary: 131 mg/dL — ABNORMAL HIGH (ref 70–99)
Glucose-Capillary: 99 mg/dL (ref 70–99)
Glucose-Capillary: 82 mg/dL (ref 70–99)
Glucose-Capillary: 131 mg/dL — ABNORMAL HIGH (ref 70–99)

## 2014-06-27 MED ORDER — INSULIN ASPART PROT & ASPART (70-30 MIX) 100 UNIT/ML PEN: 15.0000 [IU] | PEN_INJECTOR | Freq: Two times a day (BID) | SUBCUTANEOUS | Status: DC

## 2014-06-27 MED ORDER — INSULIN DETEMIR 100 UNIT/ML FLEXPEN: 45.0000 [IU] | PEN_INJECTOR | Freq: Every morning | SUBCUTANEOUS | Status: DC

## 2014-06-27 MED ORDER — FREESTYLE SYSTEM KIT: 1.0000 | PACK | Status: AC | PRN

## 2014-06-27 MED ORDER — INSULIN ASPART 100 UNIT/ML FLEXPEN: 5.0000 [IU] | PEN_INJECTOR | Freq: Three times a day (TID) | SUBCUTANEOUS | Status: DC

## 2014-06-27 MED ORDER — INSULIN DETEMIR 100 UNIT/ML ~~LOC~~ SOLN: 20.0000 [IU] | Freq: Every day | SUBCUTANEOUS | Status: DC

## 2014-06-27 MED ORDER — INSULIN ASPART 100 UNIT/ML ~~LOC~~ SOLN
5.0000 [IU] | Freq: Three times a day (TID) | SUBCUTANEOUS | Status: DC
Start: 2014-06-27 — End: 2014-06-29

## 2014-06-27 MED ORDER — INSULIN PEN NEEDLE 30G X 8 MM MISC: 1.0000 | Freq: Four times a day (QID) | Status: AC
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
## 2014-06-27 NOTE — Progress Notes (Signed)
Attempted to teach patient insulin administration. Patient vision seems to be unclear to see the correct markings on the syringe. Pen not available at this time. Patient also unable to stick the syringe in the vial correctly without bending the needle. Patient was also unsuccessful at aiming the syringe to the area of skin that was cleaned with alcohol without guidance. Will attempt again.

## 2014-06-27 NOTE — Progress Notes (Signed)
[facility] PROGRESS NOTE  Coulter Oldaker DXA:[id number] DOB: [birth year] DOA: 06/22/2014 PCP: Nicky Pugh, MD   HPI/Subjective: Patient alert x3, denies any new complaints.   Assessment/Plan: DKA/uncontrolled diabetes mellitus -DKA resolved - A1c significantly elevated at 18.8 - pt quite hesitant to start Insulin but with much convincing ultimately agreed to try it.  - have stared Levimir at 20 U QHS with mealtime Novolog at 5 U TID - however, it has now been determined that it would cost about $300 for him to pay for these medications and therefore, I plan to convert him to Novolog 70/30. Unfortunately as he received a dose of Levemir this morning, we will not be able to convert him to 70/30 until tomorrow morning. I will then need to monitor him for about 24 on 70/30 to ensure that the dosage is appropriate for him prior to discharging home.    Schizophrenia -Psychiatry consulted>> appreciate input, started on Abilify and Cogentin and initially recommended for inpatient admission - pt refusing inpatient admission - psych has decided that he is stable enough at this point to be discharged home.  - per psych he has capacity to make appropriate decisions for himself  Altered mental status -Secondary to above, improved clinically>>follow  Code Status: full Family Communication: with mother on 8/13 Disposition Plan: expect discharge on Sunday   Consultants:  Psych  Procedures:  none  Antibiotics:  none   Objective: Filed Vitals:   06/27/14 1416  BP: 113/73  Pulse: 97  Temp:   Resp: 20    Intake/Output Summary (Last 24 hours) at 06/27/14 1557 Last data filed at 06/27/14 0919  Gross per 24 hour  Intake    480 ml  Output    825 ml  Net   -345 ml   Filed Weights   06/23/14 0445  Weight: 63.7 kg (140 lb 6.9 oz)    Exam:  General: alert & oriented x 3In NAD; tongue rolling and lip smacking movements Cardiovascular: RRR, nl S1 s2 Respiratory:  CTAB* Abdomen: soft +BS NT/ND, no masses palpable Extremities: No cyanosis and no edema    Data Reviewed: Basic Metabolic Panel:  Recent Labs Lab 06/24/14 0116 06/24/14 0215 06/24/14 0636 06/24/14 0915 06/24/14 1059  NA 142 139 140 140 141  K 3.7 3.8 4.6 4.1 3.8  CL 107 107 106 109 108  CO2 21 17* 20 20 21   GLUCOSE 153* 138* 196* 128* 126*  BUN 14 14 13 12 12   CREATININE 1.05 0.93 0.96 0.96 0.97  CALCIUM 8.6 8.4 8.5 8.3* 8.4   Liver Function Tests:  Recent Labs Lab 06/22/14 2310  AST 14  ALT 14  ALKPHOS 75  BILITOT 0.4  PROT 7.7  ALBUMIN 3.8   No results found for this basename: LIPASE, AMYLASE,  in the last 168 hours No results found for this basename: AMMONIA,  in the last 168 hours CBC:  Recent Labs Lab 06/22/14 2310  WBC 8.2  NEUTROABS 5.9  HGB 14.1  HCT 42.6  MCV 81.0  PLT 220   Cardiac Enzymes:  Recent Labs Lab 06/22/14 2342  TROPONINI <0.30   BNP (last 3 results) No results found for this basename: PROBNP,  in the last 8760 hours CBG:  Recent Labs Lab 06/26/14 1139 06/26/14 1651 06/26/14 2129 06/27/14 0750 06/27/14 1145  GLUCAP 343* 156* 154* 131* 99    Recent Results (from the past 240 hour(s))  MRSA PCR SCREENING     Status: None   Collection Time  06/23/14  5:38 AM      Result Value Ref Range Status   MRSA by PCR NEGATIVE  NEGATIVE Final   Comment:            The GeneXpert MRSA Assay (FDA     approved for NASAL specimens     only), is one component of a     comprehensive MRSA colonization     surveillance program. It is not     intended to diagnose MRSA     infection nor to guide or     monitor treatment for     MRSA infections.     Studies: No results found.  Scheduled Meds: . antiseptic oral rinse  7 mL Mouth Rinse BID  . ARIPiprazole  5 mg Oral BID  . benztropine  0.5 mg Oral BID  . Canagliflozin  300 mg Oral Daily  . chlorhexidine  15 mL Mouth Rinse BID  . feeding supplement (GLUCERNA SHAKE)  237 mL  Oral TID BM  . heparin  5,000 Units Subcutaneous 3 times per day  . insulin aspart  0-9 Units Subcutaneous TID WC  . linagliptin  5 mg Oral Daily  . metFORMIN  1,000 mg Oral BID WC  . ziprasidone  10 mg Intramuscular Once   Continuous Infusions:       Time spent: 52    [facility]  [facility] Pager [phone]. If 7PM-7AM, please contact night-coverage at [email or web address], password [facility] 06/27/2014, 3:57 PM  LOS: 5 days

## 2014-06-27 NOTE — Progress Notes (Signed)
Noted that noon CBG was 99 mg/dl.  CBGs are trending down.  Recommend perhaps decreasing Levemir to 10 units for discharge, especially if discharged on oral medications as well.  Will continue to follow while in hospital.  Harvel Ricks RN BSN CDE

## 2014-06-27 NOTE — Progress Notes (Signed)
Spoke with patient about the insulin pen.  Staff RN had said that patient could not draw up insulin in syringe and give it properly.  Patient was not able to see lines on syringe for dosage.  In showing him the insulin pen, explained how he could count the clicks for each unit of insulin to take.  I asked him to show me 15 units and he was able to adequately complete that task properly and accurately.  Was able to put needle on end of pen.  Gave the saline in the demo injection pad that was provided properly.  Not sure if patient can remember the procedure.  Does have an insulin pen starter kit with instructions in the room. Encouraged staff RN to have patient give own insulin at noon. Spoke with Dr. Wynelle Cleveland about showing him the pen and how he was able to manage it.  Will probably need some reinforcement at home to remind him to take it. Case management to check insurance to see if it will cover insulin pens. Dr. Wynelle Cleveland ordered DM outpatient education.  Will continue to follow while in hospital.  Harvel Ricks RN BSN CDE
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
## 2014-06-27 NOTE — Progress Notes (Signed)
Attempted to teach patient to self administer insulin. Patient bent the needle in half when attempting to stick himself in the abdomen. Patient was continuing to try to stick himself with the bent needle stating " I haven't given myself the insulin yet" when I asked him to stop and hand me the bent needle.

## 2014-06-28 LAB — GLUCOSE, CAPILLARY
GLUCOSE-CAPILLARY: 186 mg/dL — AB (ref 70–99)
Glucose-Capillary: 161 mg/dL — ABNORMAL HIGH (ref 70–99)
Glucose-Capillary: 176 mg/dL — ABNORMAL HIGH (ref 70–99)
Glucose-Capillary: 124 mg/dL — ABNORMAL HIGH (ref 70–99)

## 2014-06-28 MED ORDER — INSULIN ASPART PROT & ASPART (70-30 MIX) 100 UNIT/ML ~~LOC~~ SUSP
15.0000 [IU] | Freq: Two times a day (BID) | SUBCUTANEOUS | Status: DC
  Administered 2014-06-28 – 2014-06-29 (×3): 15 [IU] via SUBCUTANEOUS
  Filled 2014-06-28: qty 10

## 2014-06-28 NOTE — Progress Notes (Signed)
[facility] PROGRESS NOTE  Stanly Si ZOX:[id number] DOB: 12-06-1945 DOA: 06/22/2014 PCP: Nicky Pugh, MD   HPI/Subjective: Patient doing well. No complaints.   Assessment/Plan: DKA/uncontrolled diabetes mellitus -DKA resolved - A1c significantly elevated at 18.8 - pt quite hesitant to start Insulin but with much convincing ultimately agreed to try it.  - have stared Levimir at 20 U QHS with mealtime Novolog at 5 U TID - however, it has now been determined that it would cost about $300 for him to pay for these medications and therefore, I plan to convert him to Novolog 70/30.  - will start 70/30 tonight-  I will then need to monitor him for about 24 on 70/30 to ensure that the dosage is appropriate for him prior to discharging home.  - continue to educate on how to check blood sugars   Schizophrenia -Psychiatry consulted>> appreciate input, started on Abilify and Cogentin and initially recommended for inpatient admission - pt refusing inpatient admission - psych has decided that he is stable enough at this point to be discharged home.  - per psych he has capacity to make appropriate decisions for himself  Altered mental status -Secondary to above, improved clinically>>follow  Code Status: full Family Communication: with mother on 8/13 Disposition Plan: expect discharge on Sunday   Consultants:  Psych  Procedures:  none  Antibiotics:  none   Objective: Filed Vitals:   06/28/14 1439  BP: 92/54  Pulse:   Temp:   Resp:     Intake/Output Summary (Last 24 hours) at 06/28/14 1601 Last data filed at 06/28/14 1338  Gross per 24 hour  Intake   1197 ml  Output    600 ml  Net    597 ml   Filed Weights   06/23/14 0445  Weight: 63.7 kg (140 lb 6.9 oz)    Exam:  General: alert & oriented x 3In NAD; tongue rolling and lip smacking movements Cardiovascular: RRR, nl S1 s2 Respiratory: CTAB* Abdomen: soft +BS NT/ND, no masses palpable Extremities: No cyanosis  and no edema    Data Reviewed: Basic Metabolic Panel:  Recent Labs Lab 06/24/14 0116 06/24/14 0215 06/24/14 0636 06/24/14 0915 06/24/14 1059  NA 142 139 140 140 141  K 3.7 3.8 4.6 4.1 3.8  CL 107 107 106 109 108  CO2 21 17* 20 20 21   GLUCOSE 153* 138* 196* 128* 126*  BUN 14 14 13 12 12   CREATININE 1.05 0.93 0.96 0.96 0.97  CALCIUM 8.6 8.4 8.5 8.3* 8.4   Liver Function Tests:  Recent Labs Lab 06/22/14 2310  AST 14  ALT 14  ALKPHOS 75  BILITOT 0.4  PROT 7.7  ALBUMIN 3.8   No results found for this basename: LIPASE, AMYLASE,  in the last 168 hours No results found for this basename: AMMONIA,  in the last 168 hours CBC:  Recent Labs Lab 06/22/14 2310  WBC 8.2  NEUTROABS 5.9  HGB 14.1  HCT 42.6  MCV 81.0  PLT 220   Cardiac Enzymes:  Recent Labs Lab 06/22/14 2342  TROPONINI <0.30   BNP (last 3 results) No results found for this basename: PROBNP,  in the last 8760 hours CBG:  Recent Labs Lab 06/27/14 1145 06/27/14 1641 06/27/14 2140 06/28/14 0741 06/28/14 1202  GLUCAP 99 82 131* 161* 186*    Recent Results (from the past 240 hour(s))  MRSA PCR SCREENING     Status: None   Collection Time    06/23/14  5:38 AM  Result Value Ref Range Status   MRSA by PCR NEGATIVE  NEGATIVE Final   Comment:            The GeneXpert MRSA Assay (FDA     approved for NASAL specimens     only), is one component of a     comprehensive MRSA colonization     surveillance program. It is not     intended to diagnose MRSA     infection nor to guide or     monitor treatment for     MRSA infections.     Studies: No results found.  Scheduled Meds: . antiseptic oral rinse  7 mL Mouth Rinse BID  . ARIPiprazole  5 mg Oral BID  . benztropine  0.5 mg Oral BID  . Canagliflozin  300 mg Oral Daily  . chlorhexidine  15 mL Mouth Rinse BID  . feeding supplement (GLUCERNA SHAKE)  237 mL Oral TID BM  . heparin  5,000 Units Subcutaneous 3 times per day  . insulin  aspart  0-9 Units Subcutaneous TID WC  . insulin aspart protamine- aspart  15 Units Subcutaneous BID WC  . linagliptin  5 mg Oral Daily  . metFORMIN  1,000 mg Oral BID WC  . ziprasidone  10 mg Intramuscular Once   Continuous Infusions:       Time spent: 25    [facility]  [facility] Pager [phone]. If 7PM-7AM, please contact night-coverage at [email or web address], password [facility] 06/28/2014, 4:01 PM  LOS: 6 days

## 2014-06-29 LAB — GLUCOSE, CAPILLARY
GLUCOSE-CAPILLARY: 186 mg/dL — AB (ref 70–99)
GLUCOSE-CAPILLARY: 234 mg/dL — AB (ref 70–99)
Glucose-Capillary: 171 mg/dL — ABNORMAL HIGH (ref 70–99)

## 2014-06-29 MED ORDER — INSULIN ASPART PROT & ASPART (70-30 MIX) 100 UNIT/ML PEN: 18.0000 [IU] | PEN_INJECTOR | Freq: Two times a day (BID) | SUBCUTANEOUS | Status: AC

## 2014-06-29 MED ORDER — LISINOPRIL 20 MG PO TABS: 10.0000 mg | ORAL_TABLET | Freq: Every morning | ORAL | Status: DC

## 2014-06-29 MED ORDER — INSULIN ASPART PROT & ASPART (70-30 MIX) 100 UNIT/ML ~~LOC~~ SUSP: 18.0000 [IU] | Freq: Two times a day (BID) | SUBCUTANEOUS | Status: DC
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
## 2014-06-29 NOTE — Progress Notes (Signed)
Raymond Brooks to be D/C'd Home per MD order.  Discussed with the patient and all questions fully answered.    Medication List    STOP taking these medications       INVOKANA 300 MG Tabs  Generic drug:  Canagliflozin     metoprolol succinate 25 MG 24 hr tablet  Commonly known as:  TOPROL-XL     torsemide 20 MG tablet  Commonly known as:  DEMADEX      TAKE these medications       ARIPiprazole 5 MG tablet  Commonly known as:  ABILIFY  Take 1 tablet (5 mg total) by mouth 2 (two) times daily.     benztropine 0.5 MG tablet  Commonly known as:  COGENTIN  Take 1 tablet (0.5 mg total) by mouth 2 (two) times daily.     glucose monitoring kit monitoring kit  - 1 each by Does not apply route as needed for other. Please give 100 test strips and 100 lancets   - Can replace this glucometer with any glucometer that is covered by his insurance.     Insulin Aspart Prot & Aspart (70-30) 100 UNIT/ML Pen  Commonly known as:  NOVOLOG MIX 70/30 FLEXPEN  Inject 18 Units into the skin 2 (two) times daily.     Insulin Pen Needle 30G X 8 MM Misc  Commonly known as:  NOVOFINE  Inject 10 each into the skin 4 (four) times daily.     lisinopril 20 MG tablet  Commonly known as:  PRINIVIL,ZESTRIL  Take 0.5 tablets (10 mg total) by mouth every morning.     metFORMIN 1000 MG tablet  Commonly known as:  GLUCOPHAGE  Take 1,000 mg by mouth 2 (two) times daily with a meal.     simvastatin 20 MG tablet  Commonly known as:  ZOCOR  Take 20 mg by mouth daily.     sitaGLIPtin 100 MG tablet  Commonly known as:  JANUVIA  Take 100 mg by mouth daily.        VVS and MD aware of patient's trending BPs being soft. Skin withouth evidence of skin break down or skin tears noted. IV catheter discontinued intact. Site without signs and symptoms of complications. Dressing and pressure applied.  An After Visit Summary was printed and given to the patient.  D/c education completed with patient/family including  follow up instructions, medication list, d/c activities limitations if indicated, with other d/c instructions as indicated by MD - patient able to verbalize understanding, all questions fully answered. Patient told RN that he refuses to be discharged anywhere but home. Patient taught to use glucometer and check his own sugars.Pt safely self administered own insulin with RN supervision. Pt directed by MD with RN in room to go to pharmacy today to get medications and practice using insulin pen at pharmacy with supervision.  Pt states he has the money for his prescriptions and he currently has possession of his keys.   Patient instructed to return to ED, call 911, or call MD for any changes in condition.   Patient escorted via EMS to home.     Delman Cheadle 06/29/2014 10:54 AM

## 2014-12-12 ENCOUNTER — Emergency Department: Payer: Self-pay | Admitting: Emergency Medicine

## 2014-12-17 ENCOUNTER — Ambulatory Visit: Payer: Self-pay | Admitting: Specialist

## 2014-12-21 LAB — WOUND CULTURE

## 2015-01-13 DEATH — deceased

## 2015-02-22 IMAGING — CR DG CHEST 1V PORT
1 series · 1 of 1 positions shown · non-contrast
Comparison: December 04, 2013

CLINICAL DATA: Cough and weight loss

EXAM:
PORTABLE CHEST - 1 VIEW

[ap]
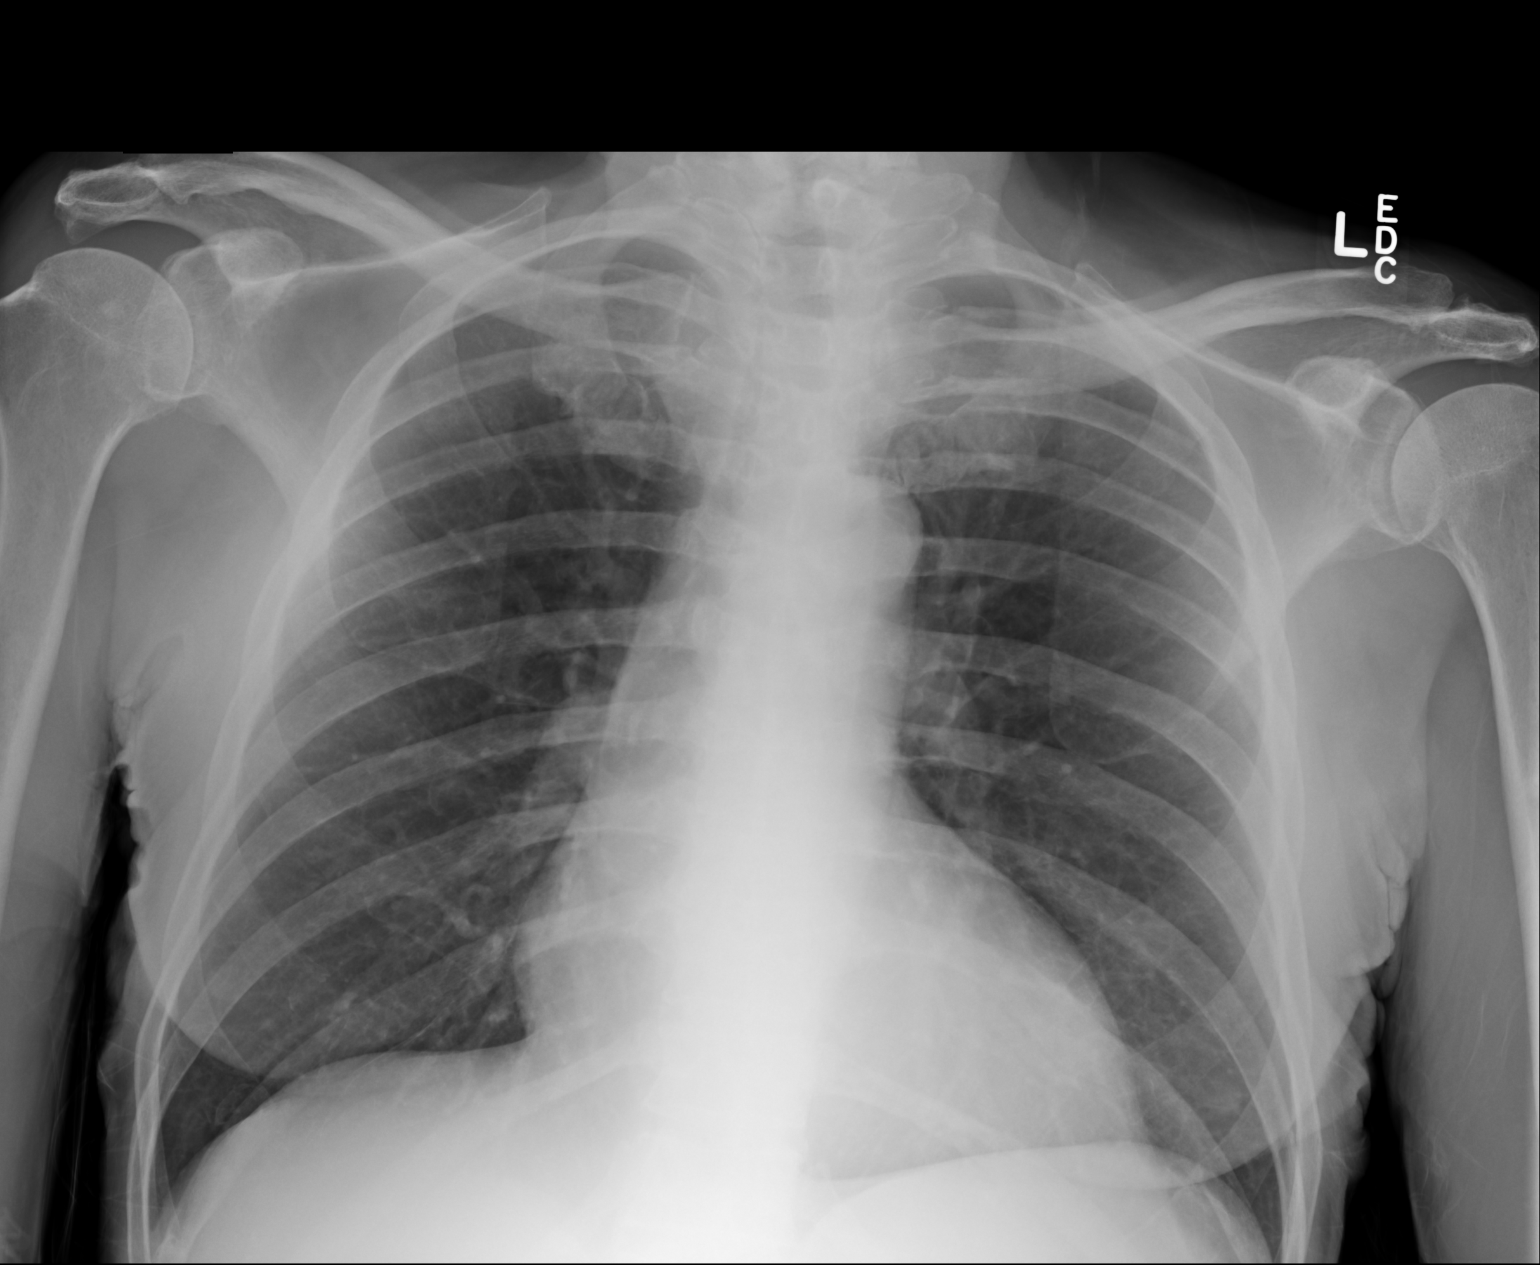

[1 of 1 positions shown; findings below may reference images not displayed]

FINDINGS: There is no edema or consolidation. Heart is borderline prominent
with normal pulmonary vascularity. No adenopathy. No bone lesions.
IMPRESSION: Heart borderline prominent.  No edema or consolidation.

## 2015-03-06 NOTE — Consult Note (Signed)
Brief Consult Note: Diagnosis: Schizophrenia.   Patient was seen by consultant.   Recommend further assessment or treatment.   Orders entered.   Comments: Pt unable to participate in the interview and was mumbling in [place] and [place]. No history could be obtained from him. He was found wandering by the neighbors. Will adjust his meds as follows:  Haldol 2mg  po Qhs Haldol 2 mg po q4 hrs prn  Benadryl 25mg  po q4 hrs prn for agitation.  Will continue to monitor..  Electronic Signatures: Jeronimo Norma (MD)  (Signed 27-Apr-14 13:13)  Authored: Brief Consult Note   Last Updated: 27-Apr-14 13:13 by Jeronimo Norma (MD)

## 2015-03-06 NOTE — Consult Note (Signed)
Brief Consult Note: Diagnosis: Schizohphrenia, Dementia NOS.   Patient was seen by consultant.   Recommend further assessment or treatment.   Orders entered.   Comments: Pt remains sleepy and has some catatonic behavior. he did not open eyes in response to verbal command. Remains disorganized and has memory issues. His Blood sugar is now stabilizing and he will be monitored closely.   Plan:  Will continue on medications and will adjust them as follows Sertraline 50mg  po qam.  Namenda 5 mg po qam.  Haldol 2mg  po qhs.  Awaiting placement in psychiatric hospital.  Electronic Signatures: Jeronimo Norma (MD)  (Signed 29-Apr-14 11:55)  Authored: Brief Consult Note   Last Updated: 29-Apr-14 11:55 by Jeronimo Norma (MD)

## 2015-03-06 NOTE — Consult Note (Signed)
Brief Consult Note: Diagnosis: Schizohphrenia, Dementia NOS.   Patient was seen by consultant.   Recommend further assessment or treatment.   Orders entered.   Comments: Pt remains at his baseline and was unable to answer any question appropriately. he stated that he is as old as "mary". Umable to tell me the day date and time. Unable to tell me his age. he was not oriented.  Case discussed at length with Dr Ulice Brilliant and she ordered the CT scan. It did not show any new changes except the age related decline int his pt. She also tried to obtain collateral info from her family.  [facility] staff is also trying to place this pt is [place] and he is on the wait list for a long time as all the facilities have declined him.   Dx: Schizophrenia  Will continue on medications and will adjust them as follows Sertraline 50mg  po qam.  Namenda 5 mg po qam.  D/C haldol Add Prolixin 5mg  po bid Cogentin 0.5mg  po Bid will continue to monitor.  Electronic Signatures: Jeronimo Norma (MD)  (Signed 01-May-14 13:44)  Authored: Brief Consult Note   Last Updated: 01-May-14 13:44 by Jeronimo Norma (MD)

## 2015-03-06 NOTE — Consult Note (Signed)
Brief Consult Note: Diagnosis: Schizohphrenia.   Patient was seen by consultant.   Recommend further assessment or treatment.   Orders entered.   Comments: Pt appeared to be more alert and calm today. he was responding to questions  but remains disorganized and has memory issues. Unable to get full history from pt.   Plan:  Will continue on medications and refer to [facility] for stabilization and safety.  Electronic Signatures: Jeronimo Norma (MD)  (Signed 27-Apr-14 13:11)  Authored: Brief Consult Note   Last Updated: 27-Apr-14 13:11 by Jeronimo Norma (MD)

## 2015-03-07 NOTE — Consult Note (Signed)
Chief Complaint:  Subjective/Chief Complaint Pt still confused with possible delusions. Denies cp?   VITAL SIGNS/ANCILLARY NOTES: **Vital Signs.:   23-Jan-15 13:36  Vital Signs Type Routine  Temperature Temperature (F) 98.5  Celsius 36.9  Temperature Source oral  Pulse Pulse 92  Respirations Respirations 18  Systolic BP Systolic BP 488  Diastolic BP (mmHg) Diastolic BP (mmHg) 83  Mean BP 100  Pulse Ox % Pulse Ox % 94  Pulse Ox Activity Level  At rest  Oxygen Delivery Room Air/ 21 %  *Intake and Output.:   Shift 23-Jan-15 15:00  Grand Totals Intake:  480 Output:      Net:  480 24 Hr.:  480  Oral Intake      In:  480  Length of Stay Totals Intake:  3521 Output:  2350    Net:  1171  Rehab Summary:   23-Jan-15 10:19  Anticipated Discharge Disposition  unsure as to his current status, discharge will more likely depend on his mental status more than physical at this point   Brief Assessment:  GEN well developed, well nourished, no acute distress, disheveled   Cardiac Regular  murmur present  -- JVD   Respiratory normal resp effort   Gastrointestinal Normal   Gastrointestinal details normal Soft  Nontender  Nondistended  No masses palpable   EXTR negative cyanosis/clubbing, negative edema   Additional Physical Exam AOX2 confused   Lab Results: Thyroid:  22-Jan-15 04:25   Thyroid Stimulating Hormone  0.44 (0.45-4.50 (International Unit)  ----------------------- Pregnant patients have  different reference  ranges for TSH:  - - - - - - - - - -  Pregnant, first trimetser:  0.36 - 2.50 uIU/mL)  Hepatic:  22-Jan-15 04:25   Bilirubin, Total 0.4  Alkaline Phosphatase 71 (45-117 NOTE: New Reference Range 10/04/13)  SGPT (ALT) 29  SGOT (AST) 28  Total Protein, Serum 7.0  Albumin, Serum 3.4  Routine Chem:  22-Jan-15 00:46   Result Comment TROPONIN - RESULTS VERIFIED BY REPEAT TESTING.  - C/ TO JACKIE PAGE $RemoveBef'@0130'WUaroyoatW$  12-05-13 BY AJO  - READ-BACK PROCESS PERFORMED.   Result(s) reported on 05 Dec 2013 at 01:32AM.    04:25   Result Comment TROPONIN - RESULTS VERIFIED BY REPEAT TESTING.  - PREV. C/ 12-05-13 $RemoveBe'@0130'dCqkmbhLV$  BY AJO. AJO  Result(s) reported on 05 Dec 2013 at 05:20AM.  Glucose, Serum 68  BUN  19  Creatinine (comp) 1.18  Sodium, Serum  147  Potassium, Serum 3.7  Chloride, Serum  110  CO2, Serum 26  Calcium (Total), Serum 9.4  Osmolality (calc) 293  eGFR (African American) >60  eGFR (Non-African American) >60 (eGFR values <69mL/min/1.73 m2 may be an indication of chronic kidney disease (CKD). Calculated eGFR is useful in patients with stable renal function. The eGFR calculation will not be reliable in acutely ill patients when serum creatinine is changing rapidly. It is not useful in  patients on dialysis. The eGFR calculation may not be applicable to patients at the low and high extremes of body sizes, pregnant women, and vegetarians.)  Anion Gap 11  Magnesium, Serum 2.3 (1.8-2.4 THERAPEUTIC RANGE: 4-7 mg/dL TOXIC: > 10 mg/dL  -----------------------)    13:20   Result Comment TROPONIN - RESULTS VERIFIED BY REPEAT TESTING.  - PREVIOUSLY CALLED 12/04/13 @ 1837  - BY SFW...MTV  Result(s) reported on 05 Dec 2013 at 02:12PM.  Cardiac:  22-Jan-15 00:46   CK, Total  273  Troponin I  1.70 (0.00-0.05 0.05 ng/mL or less: NEGATIVE  Repeat testing in 3-6 hrs  if clinically indicated. >0.05 ng/mL: POTENTIAL  MYOCARDIAL INJURY. Repeat  testing in 3-6 hrs if  clinically indicated. NOTE: An increase or decrease  of 30% or more on serial  testing suggests a  clinically important change)  CPK-MB, Serum  6.8 (Result(s) reported on 05 Dec 2013 at 02:07AM.)  CPK-MB, Serum  6.9 (Result(s) reported on 05 Dec 2013 at 01:28AM.)    04:25   CK, Total  325  Troponin I  1.80 (0.00-0.05 0.05 ng/mL or less: NEGATIVE  Repeat testing in 3-6 hrs  if clinically indicated. >0.05 ng/mL: POTENTIAL  MYOCARDIAL INJURY. Repeat  testing in 3-6 hrs if  clinically  indicated. NOTE: An increase or decrease  of 30% or more on serial  testing suggests a  clinically important change)  CPK-MB, Serum  6.9 (Result(s) reported on 05 Dec 2013 at 05:20AM.)    07:43   CK, Total  480  CPK-MB, Serum  7.0 (Result(s) reported on 05 Dec 2013 at 08:13AM.)    13:20   CK, Total  497 (Result(s) reported on 05 Dec 2013 at 02:07PM.)  Troponin I  1.90 (0.00-0.05 0.05 ng/mL or less: NEGATIVE  Repeat testing in 3-6 hrs  if clinically indicated. >0.05 ng/mL: POTENTIAL  MYOCARDIAL INJURY. Repeat  testing in 3-6 hrs if  clinically indicated. NOTE: An increase or decrease  of 30% or more on serial  testing suggests a  clinically important change)  Routine Hem:  22-Jan-15 04:25   WBC (CBC) 7.8  RBC (CBC)  4.28  Hemoglobin (CBC)  12.8  Hematocrit (CBC)  39.0  Platelet Count (CBC) 215  MCV 91  MCH 29.9  MCHC 32.9  RDW 12.8  Neutrophil % 69.2  Lymphocyte % 20.6  Monocyte % 9.0  Eosinophil % 0.3  Basophil % 0.9  Neutrophil # 5.4  Lymphocyte # 1.6  Monocyte # 0.7  Eosinophil # 0.0  Basophil # 0.1 (Result(s) reported on 05 Dec 2013 at 04:51AM.)   Radiology Results: XRay:    21-Jan-15 18:06, Chest PA and Lateral  Chest PA and Lateral   REASON FOR EXAM:    weak alterred mental statis  COMMENTS:       PROCEDURE: DXR - DXR CHEST PA (OR AP) AND LATERAL  - Dec 04 2013  6:06PM     CLINICAL DATA:  Altered mental status    EXAM:  CHEST  2 VIEW    COMPARISON:  DG CHEST 1V PORT dated 03/11/2013    FINDINGS:  Normal heart size.  Clear lungs.  No pneumothorax.   IMPRESSION:  Negative.      Electronically Signed    By: Maryclare Bean M.D.    On: 12/04/2013 18:08         Verified By: Jamas Lav, M.D.,  Cardiology:    21-Jan-15 17:51, ED ECG  Ventricular Rate 105  Atrial Rate 105  P-R Interval 138  QRS Duration 118  QT 378  QTc 499  P Axis 45  R Axis 51  T Axis -45  ECG interpretation   Sinus tachycardia with Premature atrial complexes with  Aberrant conduction  Inferior-posterior infarct (cited on or before 09-Mar-2013)  Abnormal ECG  When compared with ECG of 09-Mar-2013 03:26,  Aberrant conduction is now Present  Questionable change in initial forces of Inferior leads  ST now depressed in Lateral leads  T wave inversion more evident in Inferior leads  ----------unconfirmed----------  Confirmed by OVERREAD, NOT ([id number]), editor Helene Kelp (  [id number]) on 12/05/2013 8:16:56 AM  ED ECG   CT:    21-Jan-15 18:08, CT Head Without Contrast  CT Head Without Contrast   REASON FOR EXAM:    weak, altered mental status  COMMENTS:       PROCEDURE: CT  - CT HEAD WITHOUT CONTRAST  - Dec 04 2013  6:08PM     CLINICAL DATA:  Altered mental status    EXAM:  CT HEAD WITHOUT CONTRAST    TECHNIQUE:  Contiguous axial images wereobtained from the base of the skull  through the vertex without intravenous contrast.    COMPARISON:  03/14/2013  FINDINGS:  Moderate global atrophy. Minimal chronic ischemic changes in the  periventricular white matter. No mass effect, midline shift, or  acute intracranial hemorrhage. Mastoid air cells are clear. Intact  cranium.     IMPRESSION:  No acute intracranial pathology.      Electronically Signed    By: Maryclare Bean M.D.    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   Plan PLAN Conservative cardiac input Control Bp DM control and f/u Agree with psych input I do not rec further cardiac w/u at this point I would prefer improved mental status F/U anemia Rec ASA 81 mg daily Consider low dose b-blocker Statin if able to tolerate F/U cardiology when mental status improves I will sign off now Re consult if needed   Electronic Signatures: Yolonda Kida (MD)  (Signed 23-Jan-15 15:56)  Authored: Chief Complaint, VITAL SIGNS/ANCILLARY  NOTES, Brief Assessment, Lab Results, Radiology Results, Assessment/Plan   Last Updated: 23-Jan-15 15:56 by Lujean Amel D (MD)

## 2015-03-07 NOTE — H&P (Signed)
PATIENT NAME:  Raymond Brooks, Raymond Brooks MR#:  [id number] DATE OF BIRTH:  1946/10/08  DATE OF ADMISSION:  06/16/2014  PRIMARY CARE PHYSICIAN:  Marden Noble, MD   CHIEF COMPLAINT: Elevated blood sugars.   HISTORY OF PRESENT ILLNESS: This is a 69 year old male who was sent over from his primary care physician's office due to severely elevated blood sugars. Patient says that he is taking some oral meds for diabetes, but his blood sugars have been significantly elevated. When he came to the ER, patient was noted to have a serum glucose of over 800, and noted to be hyponatremic and in acute renal failure, but not in DKA. The patient received some IV fluids, but despite getting 2 liters IV fluids, he still continues to be hyperglycemic. He has been started on a nondiabetic ketoacidosis protocol insulin drip.   REASON FOR CONSULTATION: Hospitalist services were consulted for treatment and evaluation.   The patient denies any shortness of breath, chest pain, nausea, vomiting, abdominal pain, fevers, chills, cough or any other associated symptoms. The patient denies any increasing thirst or frequency of urination.   REVIEW OF SYSTEMS:  CONSTITUTIONAL: No documented fever, no weight gain, no weight loss.  EYES: No blurry or double vision.  ENT: No tinnitus. No postnasal drip. No redness of the oropharynx.  RESPIRATORY: No cough, no wheeze, no hemoptysis, no dyspnea.  CARDIOVASCULAR: No chest pain. No orthopnea, no palpitation or syncope.  GASTROINTESTINAL: No nausea, no vomiting, no diarrhea, no abdominal pain, no melena or hematochezia.  GENITOURINARY: No dysuria or hematuria.  ENDOCRINE: No polyuria, nocturia, or heat or cold intolerance.  HEMATOLOGY: No anemia, no bruising, or bleeding.  INTEGUMENTARY: No rashes. No lesions.  MUSCULOSKELETAL: No arthritis. No swelling. No gout.  NEUROLOGIC: No numbness, no tingling, no ataxia, no seizure activity.  PSYCHIATRIC: No anxiety, no insomnia; positive paranoid  schizophrenia.   PAST MEDICAL HISTORY: Consistent with hypertension, diabetes, paranoid schizophrenia.   ALLERGIES: No known drug allergies.   SOCIAL HISTORY: No smoking. No alcohol abuse. No illicit drug abuse; lives by himself.   FAMILY HISTORY: The patient's mother is alive. She has diabetes. Father died due to some trauma.   CURRENT MEDICATIONS:  Are as follows:  1.  Invokana 300 mg daily.  2.  Metoprolol succinate 25 mg daily.  3.  Simvastatin 20 mg daily.  5.  Torsemide 20 mg daily.   PHYSICAL EXAMINATION:  VITAL SIGNS: Presently is as follows, noted to be temperature 97.5, pulse 89, respirations 18, blood pressure 101/74, sats 100% on room air.  GENERAL: He is a pleasant-appearing male in no apparent distress.  HEAD AND EYES AND NOSE AND THROAT: He is atraumatic, normocephalic; extraocular muscles are intact; pupils equal and reactive to light; sclerae anicteric; no conjunctival injection; no oropharyngeal erythema; his oral mucosa is dry.  NECK: Supple. There is no jugular venous distention, no bruits; no lymphadenopathy, no thyromegaly.  HEART: Regular rate and rhythm. No murmurs, no rubs, no clicks.  LUNGS: Clear to auscultation bilaterally; no rales, no rhonchi, no wheezes.  ABDOMEN: Soft, flat, nontender, nondistended; he has good bowel sounds, no hepatosplenomegaly appreciated.  EXTREMITIES: No evidence of any cyanosis, clubbing; he does have +1 pitting edema from knees to ankles bilaterally, +2 pedal and radial pulses bilaterally.  NEUROLOGICAL: He is alert, awake, and oriented x 3, no focal motor or sensory deficits appreciated bilaterally; the patient does have tardive dyskinesia, which is noticeable.  SKIN: Moist and warm with no rashes.  LYMPHATIC: There is no cervical  or axillary lymphadenopathy.   LABORATORY EXAMINATION: Serum glucose of 558, BUN 28, creatinine 1.5, sodium 127, potassium 4.6, chloride 91, bicarb 28.   The patient did have a chest x-ray done, it  shows no evidence of any acute cardiopulmonary disease.   ASSESSMENT AND PLAN: This is a 69 year old male with past medical history of paranoid schizophrenia, hypertension, diabetes, presents to the hospital sent over from his primary care physician's office due to elevated blood sugars.  1.  Nonketotic hyperosmolar state. The patient was noted to have significantly elevated blood sugars greater than 800, with a normal anion gap. This is likely secondary to medical noncompliance. The patient says that he has been taking his oral meds including Invokana but his blood sugars are significantly uncontrolled; at this point, we will start the patient on insulin drip non ketoacidosis protocol. Follow blood sugars closely. We will get an endocrinology consult in the morning to help with management as oral meds may not be enough to control his diabetes.  We will check a hemoglobin A1c.  2.  Acute renal failure. This is likely due to dehydration and hyperglycemia. I will hydrate the patient with IV fluids; follow BUN and creatinine and urine output; hold his torsemide for now. 3.  Hyponatremia. This is likely due to hyperglycemia and should improve as the sugars correct.  4.  Hypertension. Continue metoprolol; hold torsemide given the renal failure.  5.  Paranoid schizophrenia. We will get a psychologic consult; patient apparently has not been taking any of his psychiatric medications; he has no hallucinations, no suicidal or homicidal ideations presently.   The patient is a full code.   TIME SPENT ON ADMISSION:  Is 50 minutes.    ____________________________ Belia Heman. Verdell Carmine, MD vjs:nt D: 06/16/2014 19:49:44 ET T: 06/16/2014 21:37:11 ET JOB#: [id number]  cc: Belia Heman. Verdell Carmine, MD, <Dictator> Henreitta Leber MD ELECTRONICALLY SIGNED 06/27/2014 14:26
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
## 2015-03-07 NOTE — Consult Note (Signed)
No Known Allergies:    Impression 1 uncontrolled DM 2. low BP 3 paranoid schizophrenia   Plan 1. increase levemir to 19 units 2. stop norvasc 3 cont all other medications  thnak you if tere are further questins pleae call me I wil be here for another week and will follow from a distance   Electronic Signatures: Bettey Costa (MD)  (Signed 04-Feb-15 13:33)  Authored: Allergies, Impression/Plan   Last Updated: 04-Feb-15 13:33 by Bettey Costa (MD)
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
## 2015-03-07 NOTE — Consult Note (Signed)
Brief Consult Note: Diagnosis: Schizophrenia, cognitive disorder NOS.   Patient was seen by consultant.   Recommend further assessment or treatment.   Orders entered.   Discussed with Attending MD.   Comments: Raymond Brooks has a h/o schizophrenia. In spite of grave diagnosis, he is quite high functioning when on medications. He was discharged from [facility] on injectable prolixin decanoate but refused injections and decompensated. Admitted for AMS.  I visited him at lunchtime. he was completely naked but peaceful in his bed. he toled me that he feels comfortable. he was hungry and wanted to eat lunch. he allowed me to put his gown on and was eagerly awaiting nursing aid to feed him. He knew he was at [facility] and told me that he lives close by. he was cooperative, Brooks agitation. He however took my pen and tried to eat it. Shortsighted? hungry?  PLAN: 1. I will order 50 mg of Prolixin decanoate injection. I will continue oral prolixin for now.  2. The patient needs hospitalization in geropsychiatry.  3. i will follow up.  Electronic Signatures: Orson Slick (MD)  (Signed 24-Jan-15 19:21)  Authored: Brief Consult Note   Last Updated: 24-Jan-15 19:21 by Orson Slick (MD)

## 2015-03-07 NOTE — Discharge Summary (Signed)
PATIENT NAME:  Raymond Brooks, Raymond Brooks MR#:  [id number] DATE OF BIRTH:  1946/11/12  DATE OF ADMISSION:  06/16/2014 DATE OF DISCHARGE:  06/17/2014  DISCHARGE DIAGNOSES: 1.  Uncontrolled diabetes mellitus.  2.  Acute renal failure.  3.  Hyponatremia.  4.  Dehydration.  5.  Noncompliance.  6.  Schizophrenia.  7.  Hypertension.   DISCHARGE MEDICATIONS: 1.  Metoprolol succinate  25 mg oral once a day.  2.  Simvastatin 20 mg oral once a day.  3.  Invokana 300 mg oral once a day.  4.  Sitagliptin 100 mg oral once a day.  5.  Metformin 1000 mg oral 2 times a day.   DISCHARGE INSTRUCTIONS: Low-sodium, carbohydrate-controlled diet. Activity as tolerated. Follow up with primary care physician and Dr. Gabriel Carina in a week.   ADMITTING HISTORY AND PHYSICAL: Please see detailed H and P dictated by Dr. Verdell Carmine. In brief, a 69 year old male patient presented to the hospital sent in from primary care physician's office after his blood sugars were found to be extremely high. In the Emergency Room, his blood sugars were in the 800s. He was started on an insulin drip with hyponatremia, admitted to the hospitalist service.   HOSPITAL COURSE: Uncontrolled diabetes mellitus. The patient was started on an insulin drip, aggressive IV fluid resuscitation with which his sugars improved. By day of discharge, his sugars were in the 200s. The patient had severely elevated blood sugars. He was thought to be a candidate for Lantus at home, but he has refused any kind of insulin and would like to take only oral medications. After counseling him regarding the need for insulin, the patient did not agree for insulin and I have started him on metformin and Januvia to his in [place] and patient will be discharged home to follow with his primary care physician and Dr. Gabriel Carina. He will need to check his blood sugars 4 times a day. If he notices any numbers more than 300, he needs to call his primary care physician and get an earlier appointment.    The patient had severe hyponatremia which was pseudohyponatremia and also contributed by dehydration but is in the normal range by day of discharge.   Prior to discharge, the patient's lungs sound clear. Heart sounds are S1, S2, without any murmurs. He does have a flat affect but no neurological deficits.   TIME SPENT TODAY ON DAY OF DISCHARGE: 35 minutes.    ____________________________ Leia Alf Joshuan Bolander, MD srs:at D: 06/19/2014 12:55:26 ET T: 06/19/2014 18:20:50 ET JOB#: [id number]  cc: Alveta Heimlich R. Stevens Magwood, MD, <Dictator> Mikeal Hawthorne. Brynda Greathouse, MD A. Lavone Orn, MD Alveta Heimlich Arlice Colt MD ELECTRONICALLY SIGNED 06/30/2014 14:10
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
## 2015-03-07 NOTE — Consult Note (Signed)
Brief Consult Note: Comments: Psychiatry: Consult received this morning. Came to see patient this afternoon and he has already been discharged.  Electronic Signatures: Gonzella Lex (MD)  (Signed 04-Aug-15 17:36)  Authored: Brief Consult Note   Last Updated: 04-Aug-15 17:36 by Gonzella Lex (MD)
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
## 2015-08-20 IMAGING — CR RIGHT MIDDLE FINGER 2+V
1 series · 3 of 3 positions shown · non-contrast
Comparison: None

CLINICAL DATA: Post reduction of RIGHT middle finger

EXAM:
RIGHT MIDDLE FINGER 2+V

[Series 1: pa · 0.17mm/px · 3 of 3 slices shown]
[im 1/3]
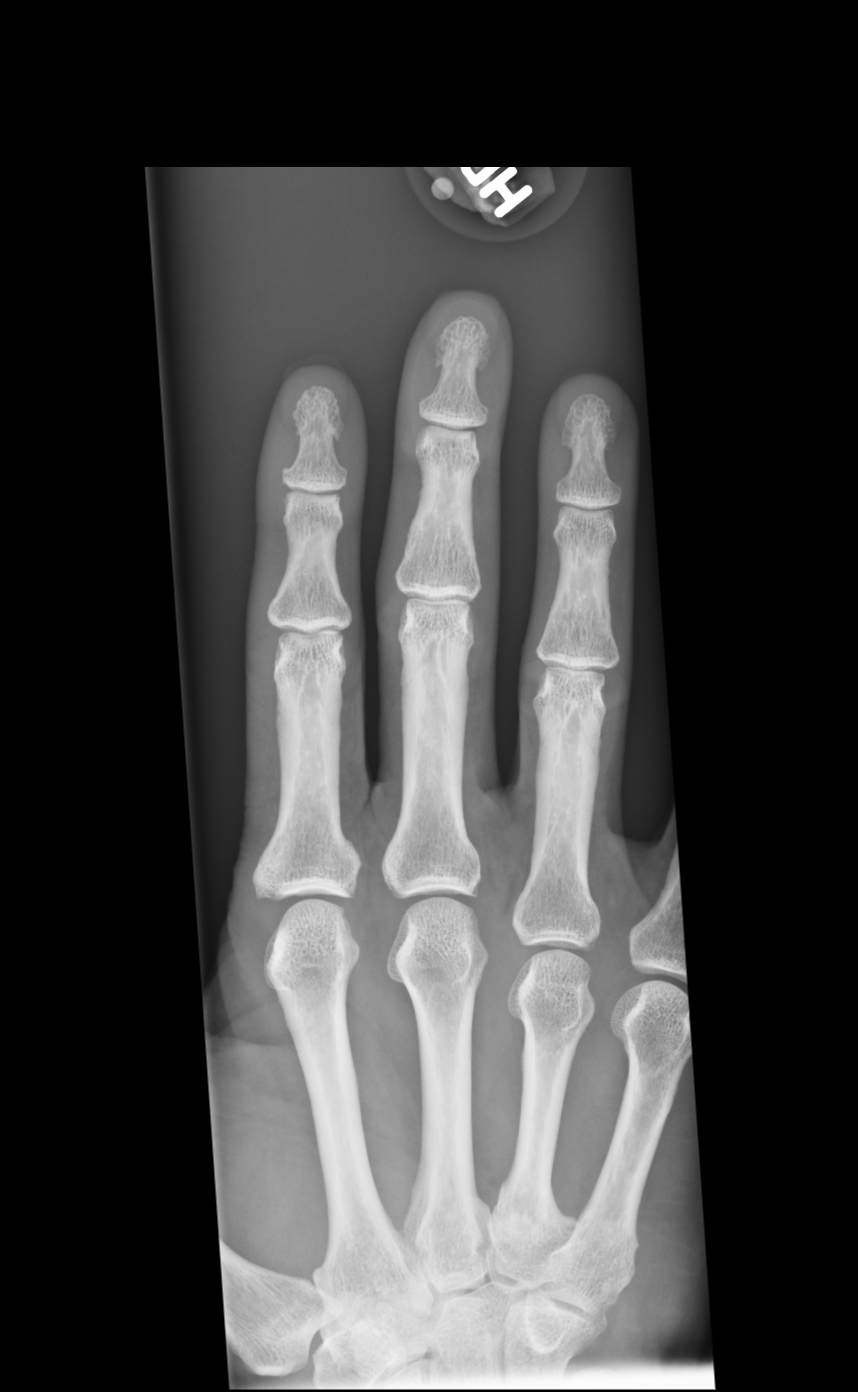
[im 2/3]
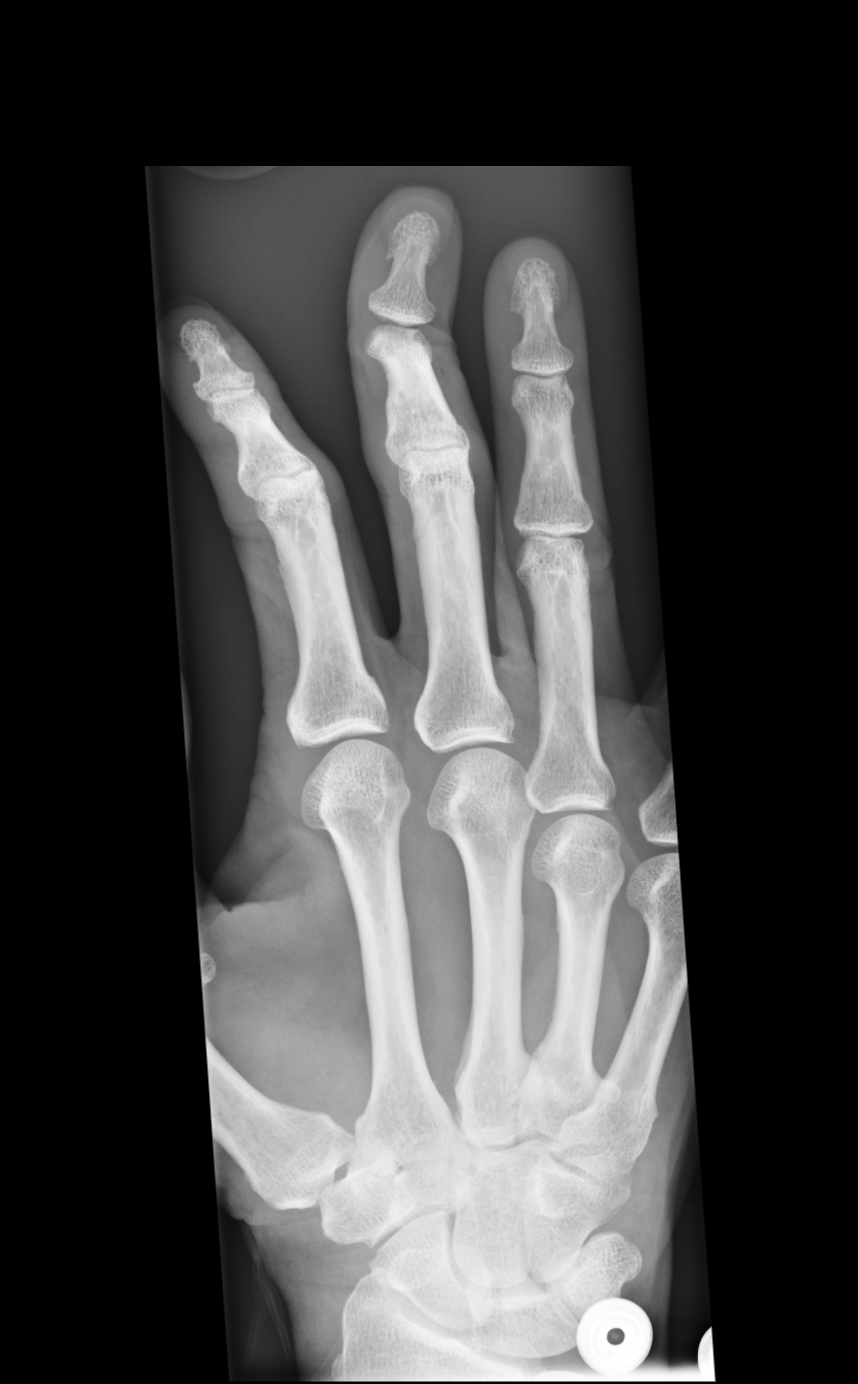
[im 3/3]
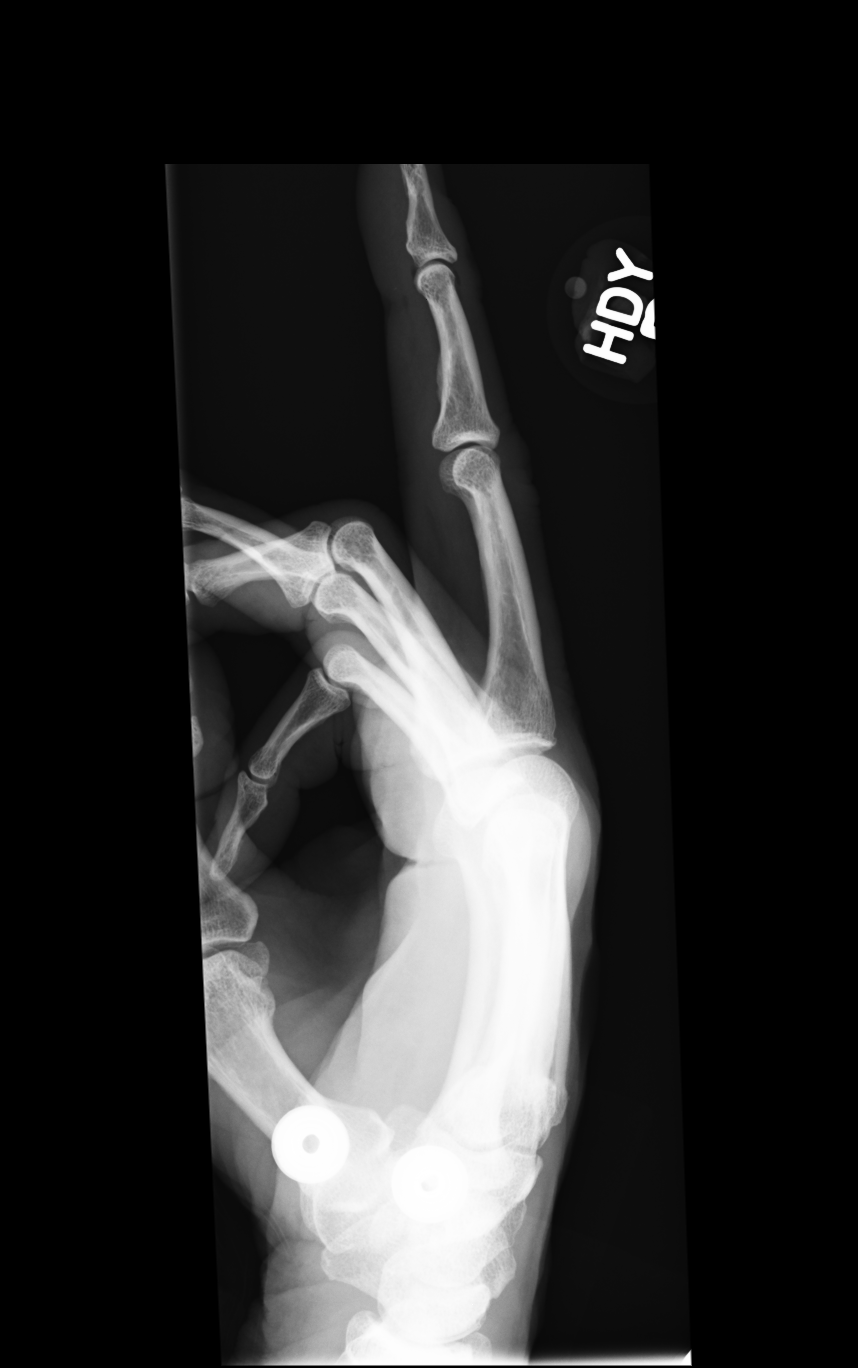

[3 of 3 positions shown; findings below may reference images not displayed]

FINDINGS: Slight hyperextension at DIP joint demonstrated on oblique view.

Osseous mineralization normal.

Joint spaces preserved.

No acute fracture, dislocation or bone destruction.
IMPRESSION: No acute osseous abnormalities.
